# Patient Record
Sex: Female | Born: 1980 | Race: White | Hispanic: No | Marital: Single | Smoking: Current every day smoker
Health system: Southern US, Community
[De-identification: ages and names within clinical notes are randomized; demographics above are authoritative.]

## PROBLEM LIST (undated history)

## (undated) DIAGNOSIS — H9190 Unspecified hearing loss, unspecified ear: Secondary | ICD-10-CM

## (undated) DIAGNOSIS — R87612 Low grade squamous intraepithelial lesion on cytologic smear of cervix (LGSIL): Secondary | ICD-10-CM

## (undated) HISTORY — PX: THROAT SURGERY: SHX803

---

## 2005-08-10 ENCOUNTER — Emergency Department: Payer: Self-pay | Admitting: Emergency Medicine

## 2006-09-15 ENCOUNTER — Emergency Department: Payer: Self-pay | Admitting: Internal Medicine

## 2011-01-10 ENCOUNTER — Emergency Department: Payer: Self-pay | Admitting: Emergency Medicine

## 2011-05-02 ENCOUNTER — Emergency Department: Payer: Self-pay | Admitting: Emergency Medicine

## 2011-05-09 ENCOUNTER — Emergency Department: Payer: Self-pay | Admitting: Emergency Medicine

## 2014-06-29 ENCOUNTER — Emergency Department: Payer: Self-pay | Admitting: Emergency Medicine

## 2014-09-18 ENCOUNTER — Inpatient Hospital Stay: Payer: Self-pay | Admitting: Obstetrics and Gynecology

## 2014-09-18 LAB — BASIC METABOLIC PANEL
ANION GAP: 10 (ref 7–16)
BUN: 8 mg/dL (ref 7–18)
CALCIUM: 9 mg/dL (ref 8.5–10.1)
CREATININE: 0.55 mg/dL — AB (ref 0.60–1.30)
Chloride: 102 mmol/L (ref 98–107)
Co2: 24 mmol/L (ref 21–32)
EGFR (Non-African Amer.): >60
GLUCOSE: 86 mg/dL (ref 65–99)
Osmolality: 270 (ref 275–301)
Potassium: 3.5 mmol/L (ref 3.5–5.1)
SODIUM: 136 mmol/L (ref 136–145)

## 2014-09-18 LAB — HEPATIC FUNCTION PANEL A (ARMC)
ALK PHOS: 86 U/L
AST: 28 U/L (ref 15–37)
Albumin: 3.9 g/dL (ref 3.4–5.0)
BILIRUBIN TOTAL: 0.4 mg/dL (ref 0.2–1.0)
Bilirubin, Direct: <0.1 mg/dL (ref 0.0–0.2)
SGPT (ALT): 22 U/L
Total Protein: 7.6 g/dL (ref 6.4–8.2)

## 2014-09-18 LAB — CBC WITH DIFFERENTIAL/PLATELET
BASOS PCT: 0.5 %
Basophil #: 0.1 10*3/uL (ref 0.0–0.1)
EOS ABS: 0.1 10*3/uL (ref 0.0–0.7)
Eosinophil %: 1.1 %
HCT: 43.7 % (ref 35.0–47.0)
HGB: 14.3 g/dL (ref 12.0–16.0)
Lymphocyte #: 1.9 10*3/uL (ref 1.0–3.6)
Lymphocyte %: 14.8 %
MCH: 29.2 pg (ref 26.0–34.0)
MCHC: 32.8 g/dL (ref 32.0–36.0)
MCV: 89 fL (ref 80–100)
Monocyte #: 0.7 x10 3/mm (ref 0.2–0.9)
Monocyte %: 5.1 %
NEUTROS PCT: 78.5 %
Neutrophil #: 10.2 10*3/uL — ABNORMAL HIGH (ref 1.4–6.5)
Platelet: 293 10*3/uL (ref 150–440)
RBC: 4.91 10*6/uL (ref 3.80–5.20)
RDW: 13.5 % (ref 11.5–14.5)
WBC: 13 10*3/uL — AB (ref 3.6–11.0)

## 2014-09-18 LAB — HCG, QUANTITATIVE, PREGNANCY: BETA HCG, QUANT.: 52941 m[IU]/mL — AB

## 2014-09-18 LAB — TSH: Thyroid Stimulating Horm: 0.536 u[IU]/mL

## 2014-09-18 LAB — URINALYSIS, COMPLETE
BILIRUBIN, UR: NEGATIVE
BLOOD: NEGATIVE
Bacteria: NONE SEEN
Glucose,UR: NEGATIVE mg/dL (ref 0–75)
LEUKOCYTE ESTERASE: NEGATIVE
Nitrite: NEGATIVE
PH: 7 (ref 4.5–8.0)
Protein: NEGATIVE
RBC,UR: 8 /HPF (ref 0–5)
SPECIFIC GRAVITY: 1.02 (ref 1.003–1.030)
Squamous Epithelial: 2

## 2014-09-18 LAB — LIPASE, BLOOD: LIPASE: 77 U/L (ref 73–393)

## 2014-09-26 ENCOUNTER — Emergency Department: Payer: Self-pay | Admitting: Emergency Medicine

## 2014-09-26 LAB — BASIC METABOLIC PANEL
Anion Gap: 7 (ref 7–16)
BUN: 14 mg/dL (ref 7–18)
CHLORIDE: 99 mmol/L (ref 98–107)
Calcium, Total: 7.8 mg/dL — ABNORMAL LOW (ref 8.5–10.1)
Co2: 27 mmol/L (ref 21–32)
Creatinine: 0.65 mg/dL (ref 0.60–1.30)
EGFR (Non-African Amer.): >60
Glucose: 104 mg/dL — ABNORMAL HIGH (ref 65–99)
OSMOLALITY: 267 (ref 275–301)
Potassium: 3.6 mmol/L (ref 3.5–5.1)
Sodium: 133 mmol/L — ABNORMAL LOW (ref 136–145)

## 2014-09-26 LAB — URINALYSIS, COMPLETE
BILIRUBIN, UR: NEGATIVE
Blood: NEGATIVE
GLUCOSE, UR: NEGATIVE mg/dL (ref 0–75)
Hyaline Cast: 2
LEUKOCYTE ESTERASE: NEGATIVE
NITRITE: NEGATIVE
PH: 6 (ref 4.5–8.0)
Specific Gravity: 1.033 (ref 1.003–1.030)
Squamous Epithelial: 2

## 2014-09-26 LAB — CBC
HCT: 43.4 % (ref 35.0–47.0)
HGB: 15 g/dL (ref 12.0–16.0)
MCH: 30.2 pg (ref 26.0–34.0)
MCHC: 34.7 g/dL (ref 32.0–36.0)
MCV: 87 fL (ref 80–100)
Platelet: 328 10*3/uL (ref 150–440)
RBC: 4.98 10*6/uL (ref 3.80–5.20)
RDW: 13.5 % (ref 11.5–14.5)
WBC: 16.9 10*3/uL — ABNORMAL HIGH (ref 3.6–11.0)

## 2014-09-26 LAB — COMPREHENSIVE METABOLIC PANEL
ANION GAP: 12 (ref 7–16)
Albumin: 4 g/dL (ref 3.4–5.0)
Alkaline Phosphatase: 83 U/L
BUN: 20 mg/dL — ABNORMAL HIGH (ref 7–18)
Bilirubin,Total: 0.6 mg/dL (ref 0.2–1.0)
CALCIUM: 9.4 mg/dL (ref 8.5–10.1)
CO2: 28 mmol/L (ref 21–32)
Chloride: 90 mmol/L — ABNORMAL LOW (ref 98–107)
Creatinine: 0.61 mg/dL (ref 0.60–1.30)
EGFR (African American): >60
EGFR (Non-African Amer.): >60
GLUCOSE: 115 mg/dL — AB (ref 65–99)
Osmolality: 264 (ref 275–301)
Potassium: 3 mmol/L — ABNORMAL LOW (ref 3.5–5.1)
SGOT(AST): 45 U/L — ABNORMAL HIGH (ref 15–37)
SGPT (ALT): 48 U/L
Sodium: 130 mmol/L — ABNORMAL LOW (ref 136–145)
TOTAL PROTEIN: 7.9 g/dL (ref 6.4–8.2)

## 2014-09-26 LAB — LIPASE, BLOOD: Lipase: 65 U/L — ABNORMAL LOW (ref 73–393)

## 2014-09-26 LAB — HCG, QUANTITATIVE, PREGNANCY: BETA HCG, QUANT.: 126868 m[IU]/mL — AB

## 2014-11-17 NOTE — L&D Delivery Note (Signed)
Delivery Note At 4:29 PM a viable female was delivered via Vaginal, Spontaneous Delivery (Presentation: direct OA).  APGAR: 8, 9; weight 5 lb 3.3 oz (2360 g).   Placenta status: Intact, Spontaneous.  Cord:  with the following complications: .  Cord pH: n/a  Anesthesia: Epidural  Episiotomy:  none Lacerations:  none Suture Repair: n/a Est. Blood Loss (mL):  200cc  Mom to postpartum.  Baby to Couplet care / Skin to Skin.  Patient was given pitocin for augmentation and received an epidural.  She progressed to complete with a bulging bag.  AROM performed and we began pushing.  Deep early decelerations with contractions/pushing to the 50s, with spontaneous recovery and moderate variability between.  Mom did not push well; could not find a comfortable position and did not respond much to coaching. After finding a position that resulted in progress with maternal effort, she pushed several times and began to crown.  Mom refused to push once baby was crowning, with fetal hearts not tracing well but palpable through the skull at approximately 60.  Ritgen maneuver was used to propel the fetal head through the perineum.  Body quickly followed, and baby was vigorous immediately.  Baby was placed on mom's chest and cord clamped/cut when pulseless.  Cord blood collected.  Placenta delivered spontaneously.  No lacerations.  We sang happy birthday to [place].     Nisa Decaire C 11/06/2015, 6:49 PM

## 2015-03-23 ENCOUNTER — Encounter: Payer: Self-pay | Admitting: Emergency Medicine

## 2015-03-23 ENCOUNTER — Emergency Department
Admission: EM | Admit: 2015-03-23 | Discharge: 2015-03-23 | Disposition: A | Discharge status: Home / Self Care | Payer: Medicaid Other | Attending: Emergency Medicine | Admitting: Emergency Medicine

## 2015-03-23 DIAGNOSIS — F17210 Nicotine dependence, cigarettes, uncomplicated: Secondary | ICD-10-CM | POA: Diagnosis not present

## 2015-03-23 DIAGNOSIS — O210 Mild hyperemesis gravidarum: Secondary | ICD-10-CM

## 2015-03-23 DIAGNOSIS — Z3A08 8 weeks gestation of pregnancy: Secondary | ICD-10-CM | POA: Diagnosis not present

## 2015-03-23 DIAGNOSIS — O99331 Smoking (tobacco) complicating pregnancy, first trimester: Secondary | ICD-10-CM | POA: Insufficient documentation

## 2015-03-23 LAB — COMPREHENSIVE METABOLIC PANEL
ALBUMIN: 4 g/dL (ref 3.5–5.0)
ALK PHOS: 61 U/L (ref 38–126)
ALT: 13 U/L — ABNORMAL LOW (ref 14–54)
AST: 17 U/L (ref 15–41)
Anion gap: 12 (ref 5–15)
BUN: 17 mg/dL (ref 6–20)
CO2: 21 mmol/L — ABNORMAL LOW (ref 22–32)
Calcium: 9.5 mg/dL (ref 8.9–10.3)
Chloride: 102 mmol/L (ref 101–111)
Creatinine, Ser: 0.54 mg/dL (ref 0.44–1.00)
GFR calc Af Amer: >60 mL/min (ref >60)
GFR calc non Af Amer: >60 mL/min (ref >60)
Glucose, Bld: 86 mg/dL (ref 65–99)
POTASSIUM: 3.6 mmol/L (ref 3.5–5.1)
Sodium: 135 mmol/L (ref 135–145)
Total Bilirubin: 0.8 mg/dL (ref 0.3–1.2)
Total Protein: 7.6 g/dL (ref 6.5–8.1)

## 2015-03-23 LAB — CBC WITH DIFFERENTIAL/PLATELET
BASOS ABS: 0 10*3/uL (ref 0–0.1)
BASOS PCT: 0 %
EOS PCT: 0 %
Eosinophils Absolute: 0 10*3/uL (ref 0–0.7)
HEMATOCRIT: 40.6 % (ref 35.0–47.0)
HEMOGLOBIN: 13.6 g/dL (ref 12.0–16.0)
Lymphocytes Relative: 11 %
Lymphs Abs: 1.4 10*3/uL (ref 1.0–3.6)
MCH: 29.1 pg (ref 26.0–34.0)
MCHC: 33.4 g/dL (ref 32.0–36.0)
MCV: 87.2 fL (ref 80.0–100.0)
Monocytes Absolute: 0.5 10*3/uL (ref 0.2–0.9)
Monocytes Relative: 4 %
Neutro Abs: 10.7 10*3/uL — ABNORMAL HIGH (ref 1.4–6.5)
Neutrophils Relative %: 85 %
Platelets: 290 10*3/uL (ref 150–440)
RBC: 4.66 MIL/uL (ref 3.80–5.20)
RDW: 13.7 % (ref 11.5–14.5)
WBC: 12.7 10*3/uL — AB (ref 3.6–11.0)

## 2015-03-23 LAB — LIPASE, BLOOD: Lipase: 23 U/L (ref 22–51)

## 2015-03-23 MED ORDER — METOCLOPRAMIDE HCL 10 MG PO TABS: 10.0000 mg | ORAL_TABLET | Freq: Three times a day (TID) | ORAL | Status: DC | PRN

## 2015-03-23 MED ORDER — ONDANSETRON HCL 4 MG/2ML IJ SOLN
INTRAMUSCULAR | Status: AC
  Administered 2015-03-23: 4 mg via INTRAVENOUS
  Filled 2015-03-23: qty 2

## 2015-03-23 MED ORDER — METOCLOPRAMIDE HCL 5 MG/ML IJ SOLN
10.0000 mg | Freq: Once | INTRAMUSCULAR | Status: AC
  Administered 2015-03-23: 10 mg via INTRAVENOUS

## 2015-03-23 MED ORDER — SODIUM CHLORIDE 0.9 % IV SOLN
1000.0000 mL | Freq: Once | INTRAVENOUS | Status: AC
  Administered 2015-03-23: 1000 mL via INTRAVENOUS

## 2015-03-23 MED ORDER — METOCLOPRAMIDE HCL 5 MG/ML IJ SOLN
INTRAMUSCULAR | Status: AC
  Administered 2015-03-23: 10 mg via INTRAVENOUS
  Filled 2015-03-23: qty 2

## 2015-03-23 MED ORDER — VITAMIN B-6 50 MG PO TABS: 75.0000 mg | ORAL_TABLET | Freq: Every day | ORAL | Status: DC

## 2015-03-23 MED ORDER — PRENATAL MULTIVITAMIN CH: 1.0000 | ORAL_TABLET | Freq: Every day | ORAL | Status: DC

## 2015-03-23 MED ORDER — ONDANSETRON HCL 4 MG/2ML IJ SOLN
4.0000 mg | Freq: Once | INTRAMUSCULAR | Status: AC
  Administered 2015-03-23: 4 mg via INTRAVENOUS

## 2015-03-23 NOTE — ED Notes (Signed)
Pt states that she is taking b6 at home already and it is not working, dr. Pershing Proudschaevitz made aware of the same

## 2015-03-23 NOTE — Discharge Instructions (Signed)
Hyperemesis Gravidarum °Hyperemesis gravidarum is a severe form of nausea and vomiting that happens during pregnancy. Hyperemesis is worse than morning sickness. It may cause you to have nausea or vomiting all day for many days. It may keep you from eating and drinking enough food and liquids. Hyperemesis usually occurs during the first half (the first 20 weeks) of pregnancy. It often goes away once a woman is in her second half of pregnancy. However, sometimes hyperemesis continues through an entire pregnancy.  °CAUSES  °The cause of this condition is not completely known but is thought to be related to changes in the body's hormones when pregnant. It could be from the high level of the pregnancy hormone or an increase in estrogen in the body.  °SIGNS AND SYMPTOMS  °· Severe nausea and vomiting. °· Nausea that does not go away. °· Vomiting that does not allow you to keep any food down. °· Weight loss and body fluid loss (dehydration). °· Having no desire to eat or not liking food you have previously enjoyed. °DIAGNOSIS  °Your health care provider will do a physical exam and ask you about your symptoms. He or she may also order blood tests and urine tests to make sure something else is not causing the problem.  °TREATMENT  °You may only need medicine to control the problem. If medicines do not control the nausea and vomiting, you will be treated in the hospital to prevent dehydration, increased acid in the blood (acidosis), weight loss, and changes in the electrolytes in your body that may harm the unborn baby (fetus). You may need IV fluids.  °HOME CARE INSTRUCTIONS  °· Only take over-the-counter or prescription medicines as directed by your health care provider. °· Try eating a couple of dry crackers or toast in the morning before getting out of bed. °· Avoid foods and smells that upset your stomach. °· Avoid fatty and spicy foods. °· Eat 5-6 small meals a day. °· Do not drink when eating meals. Drink between  meals. °· For snacks, eat high-protein foods, such as cheese. °· Eat or suck on things that have ginger in them. Ginger helps nausea. °· Avoid food preparation. The smell of food can spoil your appetite. °· Avoid iron pills and iron in your multivitamins until after 3-4 months of being pregnant. However, consult with your health care provider before stopping any prescribed iron pills. °SEEK MEDICAL CARE IF:  °· Your abdominal pain increases. °· You have a severe headache. °· You have vision problems. °· You are losing weight. °SEEK IMMEDIATE MEDICAL CARE IF:  °· You are unable to keep fluids down. °· You vomit blood. °· You have constant nausea and vomiting. °· You have excessive weakness. °· You have extreme thirst. °· You have dizziness or fainting. °· You have a fever or persistent symptoms for more than 2-3 days. °· You have a fever and your symptoms suddenly get worse. °MAKE SURE YOU:  °· Understand these instructions. °· Will watch your condition. °· Will get help right away if you are not doing well or get worse. °Document Released: 11/03/2005 Document Revised: 08/24/2013 Document Reviewed: 06/15/2013 °[employer]® Patient Information ©2015 [employer]. This information is not intended to replace advice given to you by your health care provider. Make sure you discuss any questions you have with your health care provider. ° °

## 2015-03-23 NOTE — ED Notes (Signed)
States she is about 8 weeks preg and unable to keep anything down. Active vomiting in triage. Patient Lines/Drains/Airways Status   Active Line/Drains/Airways    **None**

## 2015-03-23 NOTE — ED Provider Notes (Signed)
[facility] Emergency Department Provider Note    ____________________________________________  Time seen: 10:30 PM  I have reviewed the triage vital signs and the nursing notes.   HISTORY  Chief Complaint Morning Sickness       HPI Destiny Ferguson is a 34 y.o. female without any medical problems is about [redacted] weeks pregnant. The patient has not had any prenatal care thus far. The patient denies any vaginal discharge or bleeding. Denies any belly pain but says she has been having multiple episodes of vomiting. She was given Zofran and Reglan and some weight and now is feeling relieved. She continues to deny any abdominal pain. She says she is not on prenatal vitamins as an outpatient.     History reviewed. No pertinent past medical history.  There are no active problems to display for this patient.   History reviewed. No pertinent past surgical history.  No current outpatient prescriptions on file.  Allergies Review of patient's allergies indicates no known allergies.  History reviewed. No pertinent family history.  Social History History  Substance Use Topics  . Smoking status: Current Some Day Smoker  . Smokeless tobacco: Not on file  . Alcohol Use: No    Review of Systems  Constitutional: Negative for fever. Eyes: Negative for visual changes. ENT: Negative for sore throat. Cardiovascular: Negative for chest pain. Respiratory: Negative for shortness of breath. Gastrointestinal: Positive for vomiting.  Genitourinary: Negative for dysuria. Musculoskeletal: Negative for back pain. Skin: Negative for rash. Neurological: Negative for headaches, focal weakness or numbness.   10-point ROS otherwise negative.  ____________________________________________   PHYSICAL EXAM:  VITAL SIGNS: ED Triage Vitals  Enc Vitals Group     BP 03/23/15 1428 110/73 mmHg     Pulse Rate 03/23/15 1428 71     Resp 03/23/15 1428 20     Temp 03/23/15  1428 98.2 F (36.8 C)     Temp Source 03/23/15 1428 Oral     SpO2 03/23/15 1428 98 %     Weight 03/23/15 1428 108 lb (48.988 kg)     Height 03/23/15 1428 5\' 1"  (1.549 m)     Head Cir --      Peak Flow --      Pain Score 03/23/15 1430 0     Pain Loc --      Pain Edu? --      Excl. in GC? --      Constitutional: Alert and oriented. Well appearing and in no distress. Eyes: Conjunctivae are normal. PERRL. Normal extraocular movements. ENT   Head: Normocephalic and atraumatic.   Nose: No congestion/rhinnorhea.   Mouth/Throat: Mucous membranes are moist.   Neck: No stridor. Hematological/Lymphatic/Immunilogical: No cervical lymphadenopathy. Cardiovascular: Normal rate, regular rhythm. Normal and symmetric distal pulses are present in all extremities. No murmurs, rubs, or gallops. Respiratory: Normal respiratory effort without tachypnea nor retractions. Breath sounds are clear and equal bilaterally. No wheezes/rales/rhonchi. Gastrointestinal: Soft and nontender. No distention. No abdominal bruits. There is no CVA tenderness.  Musculoskeletal: Nontender with normal range of motion in all extremities. No joint effusions.  No lower extremity tenderness nor edema. Neurologic:  Normal speech and language. No gross focal neurologic deficits are appreciated. Speech is normal. No gait instability. Skin:  Skin is warm, dry and intact. No rash noted. Psychiatric: Mood and affect are normal. Speech and behavior are normal. Patient exhibits appropriate insight and judgment.  ____________________________________________    LABS (pertinent positives/negatives)  Mildly elevated white blood cell count.  Consistent with pregnancy.  ____________________________________________     ____________________________________________     ____________________________________________   INITIAL IMPRESSION / ASSESSMENT AND PLAN / ED COURSE  Pertinent labs & imaging results that were  available during my care of the patient were reviewed by me and considered in my medical decision making (see chart for details).  ----------------------------------------- 11:00 PM on 03/23/2015 -----------------------------------------    We'll discharge patient with prenatal vitamins and appeared on scene. Patient [facility] for previous paroxysmal follow-up there. ____________________________________________  Patient's symptoms are relieved and patient appears well. FINAL CLINICAL IMPRESSION(S) / ED DIAGNOSES  Hyperemesis gravidarum. Acute common initial visit.   Destiny Longestavid M Britzy Graul, MD 03/23/15 [phone]

## 2015-03-27 NOTE — H&P (Signed)
L&D Evaluation:  History:  HPI 33yo [id number] at 6+5/7 by sure LMP of 08/02/04 presenting with 4 days of n/v, no tolerance of po intake including water. Hx of hyperem with all prior pregnancies, unrelieved throughout preg per pt, but has never been hospitalized and never been on any medications. Not currently on PNV or any other meds.  In ER, received 3 L of fluid as well as phenergen without relief.  Pt is poor historian. Denies PMH, PSH, FH. SH sig for 10 cig daily.   Presents with nausea/vomiting   Patient's Medical History No Chronic Illness   Patient's Surgical History none   Medications None   Allergies NKDA   Social History tobacco   Family History Non-Contributory   ROS:  ROS All systems were reviewed.  HEENT, CNS, GI, GU, Respiratory, CV, Renal and Musculoskeletal systems were found to be normal.   Exam:  Vital Signs stable   General no apparent distress   Mental Status clear   Chest clear   Heart normal sinus rhythm   Abdomen non tender   Skin dry   Lymph no lymphadenopathy   Impression:  Impression nausea/vomiting in pregnancy   Plan:  Plan fluids, Reglan, phenergen,thiamine, zofran if no improvement, IVF, NPO except for ice chips   Comments Reasses in am   Electronic Signatures: Cline CoolsBeasley, Safa Derner E (MD)  (Signed [phone]-Nov-15 19:47)  Authored: L&D Evaluation   Last Updated: 02-Nov-15 19:47 by Cline CoolsBeasley, Rhealyn Cullen E (MD)

## 2015-07-05 LAB — OB RESULTS CONSOLE HIV ANTIBODY (ROUTINE TESTING): HIV: NONREACTIVE

## 2015-07-06 LAB — OB RESULTS CONSOLE ABO/RH: RH TYPE: POSITIVE

## 2015-07-06 LAB — OB RESULTS CONSOLE GC/CHLAMYDIA
Chlamydia: NEGATIVE
Gonorrhea: NEGATIVE

## 2015-07-06 LAB — OB RESULTS CONSOLE ANTIBODY SCREEN: Antibody Screen: NEGATIVE

## 2015-07-06 LAB — OB RESULTS CONSOLE HEPATITIS B SURFACE ANTIGEN: HEP B S AG: NEGATIVE

## 2015-07-06 LAB — OB RESULTS CONSOLE RPR: RPR: NONREACTIVE

## 2015-07-13 LAB — OB RESULTS CONSOLE VARICELLA ZOSTER ANTIBODY, IGG: Varicella: IMMUNE

## 2015-07-13 LAB — OB RESULTS CONSOLE RUBELLA ANTIBODY, IGM: Rubella: IMMUNE

## 2015-11-06 ENCOUNTER — Encounter: Payer: Self-pay | Admitting: Anesthesiology

## 2015-11-06 ENCOUNTER — Inpatient Hospital Stay
Admission: EM | Admit: 2015-11-06 | Discharge: 2015-11-08 | DRG: 775 | Disposition: A | Discharge status: Home / Self Care | Payer: Medicaid Other | Attending: Obstetrics & Gynecology | Admitting: Obstetrics & Gynecology

## 2015-11-06 ENCOUNTER — Encounter: Payer: Self-pay | Admitting: *Deleted

## 2015-11-06 ENCOUNTER — Inpatient Hospital Stay: Payer: Medicaid Other | Admitting: Certified Registered"

## 2015-11-06 DIAGNOSIS — O365990 Maternal care for other known or suspected poor fetal growth, unspecified trimester, not applicable or unspecified: Secondary | ICD-10-CM | POA: Diagnosis present

## 2015-11-06 DIAGNOSIS — F17210 Nicotine dependence, cigarettes, uncomplicated: Secondary | ICD-10-CM | POA: Diagnosis present

## 2015-11-06 DIAGNOSIS — Z3A41 41 weeks gestation of pregnancy: Secondary | ICD-10-CM

## 2015-11-06 DIAGNOSIS — H9190 Unspecified hearing loss, unspecified ear: Secondary | ICD-10-CM | POA: Diagnosis present

## 2015-11-06 DIAGNOSIS — R87612 Low grade squamous intraepithelial lesion on cytologic smear of cervix (LGSIL): Secondary | ICD-10-CM | POA: Diagnosis present

## 2015-11-06 DIAGNOSIS — O99334 Smoking (tobacco) complicating childbirth: Secondary | ICD-10-CM | POA: Diagnosis present

## 2015-11-06 DIAGNOSIS — O480 Post-term pregnancy: Secondary | ICD-10-CM | POA: Diagnosis present

## 2015-11-06 DIAGNOSIS — O09523 Supervision of elderly multigravida, third trimester: Secondary | ICD-10-CM

## 2015-11-06 HISTORY — DX: Low grade squamous intraepithelial lesion on cytologic smear of cervix (LGSIL): R87.612

## 2015-11-06 HISTORY — DX: Unspecified hearing loss, unspecified ear: H91.90

## 2015-11-06 LAB — ABO/RH: ABO/RH(D): O POS

## 2015-11-06 LAB — URINE DRUG SCREEN, QUALITATIVE (ARMC ONLY)
Amphetamines, Ur Screen: NOT DETECTED
BARBITURATES, UR SCREEN: NOT DETECTED
BENZODIAZEPINE, UR SCRN: NOT DETECTED
Cannabinoid 50 Ng, Ur ~~LOC~~: NOT DETECTED
Cocaine Metabolite,Ur ~~LOC~~: NOT DETECTED
MDMA (Ecstasy)Ur Screen: NOT DETECTED
METHADONE SCREEN, URINE: NOT DETECTED
OPIATE, UR SCREEN: NOT DETECTED
Phencyclidine (PCP) Ur S: NOT DETECTED
TRICYCLIC, UR SCREEN: NOT DETECTED

## 2015-11-06 LAB — CHLAMYDIA/NGC RT PCR (ARMC ONLY)
Chlamydia Tr: NOT DETECTED
N GONORRHOEAE: NOT DETECTED

## 2015-11-06 LAB — TYPE AND SCREEN
ABO/RH(D): O POS
ANTIBODY SCREEN: NEGATIVE

## 2015-11-06 LAB — RAPID HIV SCREEN (HIV 1/2 AB+AG)
HIV 1/2 Antibodies: NONREACTIVE
HIV-1 P24 ANTIGEN - HIV24: NONREACTIVE

## 2015-11-06 LAB — CBC
HCT: 38.2 % (ref 35.0–47.0)
HEMOGLOBIN: 12.6 g/dL (ref 12.0–16.0)
MCH: 27.4 pg (ref 26.0–34.0)
MCHC: 32.9 g/dL (ref 32.0–36.0)
MCV: 83.3 fL (ref 80.0–100.0)
Platelets: 287 10*3/uL (ref 150–440)
RBC: 4.58 MIL/uL (ref 3.80–5.20)
RDW: 15.2 % — ABNORMAL HIGH (ref 11.5–14.5)
WBC: 13.9 10*3/uL — AB (ref 3.6–11.0)

## 2015-11-06 MED ORDER — LANOLIN HYDROUS EX OINT: TOPICAL_OINTMENT | CUTANEOUS | Status: DC | PRN

## 2015-11-06 MED ORDER — ONDANSETRON HCL 4 MG/2ML IJ SOLN: 4.0000 mg | INTRAMUSCULAR | Status: DC | PRN

## 2015-11-06 MED ORDER — OXYTOCIN 40 UNITS IN LACTATED RINGERS INFUSION - SIMPLE MED
62.5000 mL/h | INTRAVENOUS | Status: DC | PRN
  Filled 2015-11-06: qty 1000

## 2015-11-06 MED ORDER — OXYTOCIN 40 UNITS IN LACTATED RINGERS INFUSION - SIMPLE MED
62.5000 mL/h | INTRAVENOUS | Status: DC
  Filled 2015-11-06: qty 1000

## 2015-11-06 MED ORDER — PRENATAL MULTIVITAMIN CH
1.0000 | ORAL_TABLET | Freq: Every day | ORAL | Status: DC
  Administered 2015-11-07 – 2015-11-08 (×2): 1 via ORAL
  Filled 2015-11-06 (×2): qty 1

## 2015-11-06 MED ORDER — DIBUCAINE 1 % RE OINT: 1.0000 "application " | TOPICAL_OINTMENT | RECTAL | Status: DC | PRN

## 2015-11-06 MED ORDER — TERBUTALINE SULFATE 1 MG/ML IJ SOLN: 0.2500 mg | Freq: Once | INTRAMUSCULAR | Status: DC | PRN

## 2015-11-06 MED ORDER — OXYTOCIN BOLUS FROM INFUSION: 500.0000 mL | INTRAVENOUS | Status: DC

## 2015-11-06 MED ORDER — INFLUENZA VAC SPLIT QUAD 0.5 ML IM SUSY: 0.5000 mL | PREFILLED_SYRINGE | INTRAMUSCULAR | Status: DC | PRN

## 2015-11-06 MED ORDER — BUTORPHANOL TARTRATE 1 MG/ML IJ SOLN: 1.0000 mg | INTRAMUSCULAR | Status: DC | PRN

## 2015-11-06 MED ORDER — ONDANSETRON HCL 4 MG PO TABS: 4.0000 mg | ORAL_TABLET | ORAL | Status: DC | PRN

## 2015-11-06 MED ORDER — ACETAMINOPHEN 325 MG PO TABS: 650.0000 mg | ORAL_TABLET | ORAL | Status: DC | PRN

## 2015-11-06 MED ORDER — LACTATED RINGERS IV SOLN
INTRAVENOUS | Status: DC
  Administered 2015-11-06: 10:00:00 via INTRAVENOUS

## 2015-11-06 MED ORDER — FENTANYL 2.5 MCG/ML W/ROPIVACAINE 0.2% IN NS 100 ML EPIDURAL INFUSION (ARMC-ANES)
EPIDURAL | Status: AC
  Administered 2015-11-06: 9 mL/h via EPIDURAL
  Filled 2015-11-06: qty 100

## 2015-11-06 MED ORDER — LIDOCAINE HCL (PF) 1 % IJ SOLN
30.0000 mL | INTRAMUSCULAR | Status: DC | PRN
  Filled 2015-11-06: qty 30

## 2015-11-06 MED ORDER — MISOPROSTOL 200 MCG PO TABS
ORAL_TABLET | ORAL | Status: AC
  Filled 2015-11-06: qty 4

## 2015-11-06 MED ORDER — OXYTOCIN 40 UNITS IN LACTATED RINGERS INFUSION - SIMPLE MED
1.0000 m[IU]/min | INTRAVENOUS | Status: DC
  Administered 2015-11-06: 1 m[IU]/min via INTRAVENOUS

## 2015-11-06 MED ORDER — LIDOCAINE-EPINEPHRINE (PF) 1.5 %-1:200000 IJ SOLN
INTRAMUSCULAR | Status: DC | PRN
  Administered 2015-11-06: 3 mL via EPIDURAL

## 2015-11-06 MED ORDER — SIMETHICONE 80 MG PO CHEW: 80.0000 mg | CHEWABLE_TABLET | ORAL | Status: DC | PRN

## 2015-11-06 MED ORDER — TETANUS-DIPHTH-ACELL PERTUSSIS 5-2.5-18.5 LF-MCG/0.5 IM SUSP: 0.5000 mL | INTRAMUSCULAR | Status: DC | PRN

## 2015-11-06 MED ORDER — BUPIVACAINE HCL (PF) 0.25 % IJ SOLN
INTRAMUSCULAR | Status: DC | PRN
  Administered 2015-11-06: 5 mL via EPIDURAL

## 2015-11-06 MED ORDER — CITRIC ACID-SODIUM CITRATE 334-500 MG/5ML PO SOLN: 30.0000 mL | ORAL | Status: DC | PRN

## 2015-11-06 MED ORDER — HYDROCODONE-ACETAMINOPHEN 5-325 MG PO TABS
1.0000 | ORAL_TABLET | ORAL | Status: DC | PRN
  Administered 2015-11-06 – 2015-11-07 (×4): 1 via ORAL
  Filled 2015-11-06 (×4): qty 1

## 2015-11-06 MED ORDER — IBUPROFEN 600 MG PO TABS
600.0000 mg | ORAL_TABLET | Freq: Four times a day (QID) | ORAL | Status: DC
  Administered 2015-11-06 – 2015-11-08 (×7): 600 mg via ORAL
  Filled 2015-11-06 (×7): qty 1

## 2015-11-06 MED ORDER — LACTATED RINGERS IV SOLN: 500.0000 mL | INTRAVENOUS | Status: DC | PRN

## 2015-11-06 MED ORDER — AMMONIA AROMATIC IN INHA
RESPIRATORY_TRACT | Status: AC
  Filled 2015-11-06: qty 10

## 2015-11-06 MED ORDER — DIPHENHYDRAMINE HCL 25 MG PO CAPS: 25.0000 mg | ORAL_CAPSULE | Freq: Four times a day (QID) | ORAL | Status: DC | PRN

## 2015-11-06 MED ORDER — WITCH HAZEL-GLYCERIN EX PADS: 1.0000 "application " | MEDICATED_PAD | CUTANEOUS | Status: DC | PRN

## 2015-11-06 MED ORDER — ONDANSETRON HCL 4 MG/2ML IJ SOLN: 4.0000 mg | Freq: Four times a day (QID) | INTRAMUSCULAR | Status: DC | PRN

## 2015-11-06 MED ORDER — DOCUSATE SODIUM 100 MG PO CAPS
100.0000 mg | ORAL_CAPSULE | Freq: Two times a day (BID) | ORAL | Status: DC
  Administered 2015-11-06 – 2015-11-08 (×4): 100 mg via ORAL
  Filled 2015-11-06 (×4): qty 1

## 2015-11-06 NOTE — Progress Notes (Signed)
RN to the bedside to assist pt. To BR for post delivery void.  Pt. Voided large amt. Of urine, fresh ice pack and peripad put in place. Steady gait. VS WNL, pt. Ready for transfer to postpartum unit via WC.  Nursery RN at the bedside to transfer infant via basinet along with pt.
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
## 2015-11-06 NOTE — Anesthesia Procedure Notes (Signed)
Epidural Patient location during procedure: OB Start time: 11/06/2015 3:22 PM End time: 11/06/2015 3:28 PM  Staffing Anesthesiologist: Berdine AddisonHOMAS, MATHAI Resident/CRNA: Mathews ArgyleLOGAN, Amity Roes Other anesthesia staff: Omer JackWEATHERLY, JANICE Performed by: resident/CRNA   Preanesthetic Checklist Completed: patient identified, site marked, surgical consent, pre-op evaluation, timeout performed, IV checked, risks and benefits discussed and monitors and equipment checked  Epidural Patient position: sitting Prep: Betadine and site prepped and draped Patient monitoring: heart rate, continuous pulse ox and blood pressure Approach: midline Location: L4-L5 Injection technique: LOR saline  Needle:  Needle type: Tuohy  Needle gauge: 18 G Needle length: 9 cm Needle insertion depth: 4.5 cm Catheter type: closed end flexible Catheter size: 20 Guage Catheter at skin depth: 9.5 cm Test dose: negative and 1.5% lidocaine with Epi 1:200 K  Assessment Events: blood not aspirated, injection not painful, no injection resistance, negative IV test and no paresthesia  Additional Notes   Patient tolerated the insertion well without complications.Reason for block:procedure for pain

## 2015-11-06 NOTE — H&P (Signed)
OB History & Physical   History of Present Illness:  Chief Complaint:  Presented with contractions q5 min apart since 0530 HPI:  Destiny Ferguson is a 34 y.o. 428P0050 female with EDC=10/29/2015 at 5849w1d dated by LMP=01/22/2015 and c/w a 24wk 2d ultrasound.  She presented to L&D for evaluation of labor this AM. Upon presentation was 3 cm dilated and there has been no change in cervix over 3 hours, but patient states her contractions are getting stronger. Had some bloody show after her cervical exam. No LOF otherwise. Started care at [facility] at 23 weeks and did not return after Oct 21. LGSIL  PAP- no showed for colpo. AMA- no genetic testing done. Desires BTL pp and 30 day papers have been signed ROS positive for a cough, nasal congestion and rhinorrhea. No sore throat or fever.   Pregnancy Issues: 1. Late entry to prenatal care and no care in third trimester 2. Low BMI 3. AMA 4. Smoker Maternal Medical History:   Past Medical History  Diagnosis Date  . Hearing loss     Wears hearing aid  . Low grade squamous intraepithelial lesion (LGSIL) on cervical Pap smear     Past Surgical History  Procedure Laterality Date  . Throat surgery      Tonsilectomy    No Known Allergies  Prior to Admission medications                               Denies taking any medications at this time   [facility]   Social History: She  reports that she has quit smoking. She has never used smokeless tobacco. She reports that she does not drink alcohol or use illicit drugs. Was smoking 1/2 PPD  Family History: family history includes Asthma in her mother; Cancer - Cervical in her mother; Heart disease in her maternal grandmother; Hypertension in her maternal grandmother.   Review of Systems: Negative x 10 systems reviewed except as noted in the HPI.     Physical Exam:  Vital Signs: BP 117/79 mmHg  Pulse 76  Temp(Src) 98.1 F (36.7 C) (Oral)   Resp 18  Ht 5\' 1"  (1.549 m)  Wt 50.712 kg (111 lb 12.8 oz)  BMI 21.14 kg/m2  LMP 01/22/2015 General: breathing thru contractions HEENT: normocephalic, atraumatic Heart: regular rate & rhythm.  No murmurs Lungs: clear to auscultation bilaterally, normal respiratory effort Abdomen: soft, gravid, non-tender;  EFW:5# Pelvic:   External: Normal external female genitalia  Cervix: Dilation: 3 / Effacement (%): 80 / Station: -1    Extremities: non-tender, symmetric, no edema bilaterally.  DTRs:+2 Neurologic: Alert & oriented x 3.    Results for orders placed or performed during the hospital encounter of 11/06/15 (from the past 24 hour(s))  CBC     Status: Abnormal   Collection Time: 11/06/15 10:24 AM  Result Value Ref Range   WBC 13.9 (H) 3.6 - 11.0 K/uL   RBC 4.58 3.80 - 5.20 MIL/uL   Hemoglobin 12.6 12.0 - 16.0 g/dL   HCT 40.938.2 81.135.0 - 91.447.0 %   MCV 83.3 80.0 - 100.0 fL   MCH 27.4 26.0 - 34.0 pg   MCHC 32.9 32.0 - 36.0 g/dL   RDW 78.215.2 (H) 95.611.5 - 21.314.5 %   Platelets 287 150 - 440 K/uL  Type and screen [facility]     Status: None (Preliminary result)  Collection Time: 11/06/15 10:25 AM  Result Value Ref Range   ABO/RH(D) PENDING    Antibody Screen PENDING    Sample Expiration 11/09/2015     Pertinent Results:  Prenatal Labs: Blood type/Rh O POS  Antibody screen neg  Rubella Immune  Varicella Immune  RPR NR  HBsAg Neg  HIV NR  GC neg  Chlamydia neg  Genetic screening negative  1 hour GTT 123  3 hour GTT NA  GBS Not done   FHT: 135-140 with accelerations to 160s, mod variability, occ variable with contractions TOCO: q 4-7 min apart    Cephalic by leopolds  Assessment:  Destiny Ferguson is a 34 y.o. G48P0050 female at [redacted]w[redacted]d with early labor-will augment since 53 weeks, poor prenatal care and SGA Small for gestational age Insufficient prenatal care   Plan:  1. Admit to Labor & Delivery 2. CBC, T&S, Clrs, IVF 3. GBS culture done  4. Consents  obtained. 5. Continuous efm/toco 6. Pitocin augmentation  -----  Farrel Conners CNM [facility]

## 2015-11-06 NOTE — Anesthesia Preprocedure Evaluation (Signed)
Anesthesia Evaluation  Patient identified by MRN, date of birth, ID band Patient awake    Reviewed: Allergy & Precautions, NPO status , Patient's Chart, lab work & pertinent test results, reviewed documented beta blocker date and time   Airway Mallampati: II  TM Distance: >3 FB     Dental  (+) Chipped   Pulmonary former smoker,           Cardiovascular      Neuro/Psych    GI/Hepatic   Endo/Other    Renal/GU      Musculoskeletal   Abdominal   Peds  Hematology   Anesthesia Other Findings   Reproductive/Obstetrics                             Anesthesia Physical Anesthesia Plan  ASA: II  Anesthesia Plan: Epidural   Post-op Pain Management:    Induction:   Airway Management Planned:   Additional Equipment:   Intra-op Plan:   Post-operative Plan:   Informed Consent: I have reviewed the patients History and Physical, chart, labs and discussed the procedure including the risks, benefits and alternatives for the proposed anesthesia with the patient or authorized representative who has indicated his/her understanding and acceptance.     Plan Discussed with: CRNA  Anesthesia Plan Comments:         Anesthesia Quick Evaluation  

## 2015-11-07 LAB — CBC
HEMATOCRIT: 33.1 % — AB (ref 35.0–47.0)
HEMOGLOBIN: 10.9 g/dL — AB (ref 12.0–16.0)
MCH: 27.7 pg (ref 26.0–34.0)
MCHC: 33 g/dL (ref 32.0–36.0)
MCV: 84 fL (ref 80.0–100.0)
Platelets: 263 10*3/uL (ref 150–440)
RBC: 3.94 MIL/uL (ref 3.80–5.20)
RDW: 15.1 % — ABNORMAL HIGH (ref 11.5–14.5)
WBC: 13.5 10*3/uL — AB (ref 3.6–11.0)

## 2015-11-07 LAB — RPR: RPR: NONREACTIVE

## 2015-11-07 NOTE — Progress Notes (Signed)
Subjective:    Objective:   Blood pressure 114/74, pulse 80, temperature 98 F (36.7 C), temperature source Oral, resp. rate 20, height  (1.549 m), weight 50.712 kg (111 lb 12.8 oz), last menstrual period 01/22/2015, SpO2 98 %, unknown if currently breastfeeding.  General: NAD Pulmonary: no increased work of breathing Abdomen: non-distended, non-tender, fundus firm at level of umbilicus Extremities: no edema, no erythema, no tenderness  Results for orders placed or performed during the hospital encounter of 11/06/15 (from the past 72 hour(s))  Rapid HIV screen (HIV 1/2 Ab+Ag) ([facility] Only)     Status: None   Collection Time: 11/06/15 10:24 AM  Result Value Ref Range   HIV-1 P24 Antigen - HIV24 NON REACTIVE NON REACTIVE   HIV 1/2 Antibodies NON REACTIVE NON REACTIVE   Interpretation (HIV Ag Ab)      A non reactive test result means that HIV 1 or HIV 2 antibodies and HIV 1 p24 antigen were not detected in the specimen.  CBC     Status: Abnormal   Collection Time: 11/06/15 10:24 AM  Result Value Ref Range   WBC 13.9 (H) 3.6 - 11.0 K/uL   RBC 4.58 3.80 - 5.20 MIL/uL   Hemoglobin 12.6 12.0 - 16.0 g/dL   HCT 29.5 28.4 - 13.2 %   MCV 83.3 80.0 - 100.0 fL   MCH 27.4 26.0 - 34.0 pg   MCHC 32.9 32.0 - 36.0 g/dL   RDW 44.0 (H) 10.2 - 72.5 %   Platelets 287 150 - 440 K/uL  RPR     Status: None   Collection Time: 11/06/15 10:24 AM  Result Value Ref Range   RPR Ser Ql Non Reactive Non Reactive    Comment: (NOTE) Performed At: [facility] [address] [place], [place] [id number] Mila Homer MD QQ:[id number]   Type and screen [facility]     Status: None   Collection Time: 11/06/15 10:25 AM  Result Value Ref Range   ABO/RH(D) O POS    Antibody Screen NEG    Sample Expiration 11/09/2015   ABO/Rh     Status: None   Collection Time: 11/06/15 10:26 AM  Result Value Ref Range   ABO/RH(D) O POS   Urine Drug Screen, Qualitative ([facility] only)      Status: None   Collection Time: 11/06/15 10:41 AM  Result Value Ref Range   Tricyclic, Ur Screen NONE DETECTED NONE DETECTED   Amphetamines, Ur Screen NONE DETECTED NONE DETECTED   MDMA (Ecstasy)Ur Screen NONE DETECTED NONE DETECTED   Cocaine Metabolite,Ur [place] NONE DETECTED NONE DETECTED   Opiate, Ur Screen NONE DETECTED NONE DETECTED   Phencyclidine (PCP) Ur S NONE DETECTED NONE DETECTED   Cannabinoid 50 Ng, Ur [place] NONE DETECTED NONE DETECTED   Barbiturates, Ur Screen NONE DETECTED NONE DETECTED   Benzodiazepine, Ur Scrn NONE DETECTED NONE DETECTED   Methadone Scn, Ur NONE DETECTED NONE DETECTED    Comment: (NOTE) 100  Tricyclics, urine               Cutoff 1000 ng/mL 200  Amphetamines, urine             Cutoff 1000 ng/mL 300  MDMA (Ecstasy), urine           Cutoff 500 ng/mL 400  Cocaine Metabolite, urine       Cutoff 300 ng/mL 500  Opiate, urine  Cutoff 300 ng/mL 600  Phencyclidine (PCP), urine      Cutoff 25 ng/mL 700  Cannabinoid, urine              Cutoff 50 ng/mL 800  Barbiturates, urine             Cutoff 200 ng/mL 900  Benzodiazepine, urine           Cutoff 200 ng/mL 1000 Methadone, urine                Cutoff 300 ng/mL 1100 1200 The urine drug screen provides only a preliminary, unconfirmed 1300 analytical test result and should not be used for non-medical 1400 purposes. Clinical consideration and professional judgment should 1500 be applied to any positive drug screen result due to possible 1600 interfering substances. A more specific alternate chemical method 1700 must be used in order to obtain a confirmed analytical result.  1800 Gas chromato graphy / mass spectrometry (GC/MS) is the preferred 1900 confirmatory method.   Chlamydia/NGC rt PCR ([facility] only)     Status: None   Collection Time: 11/06/15 10:41 AM  Result Value Ref Range   Specimen source GC/Chlam URINE, RANDOM    Chlamydia Tr NOT DETECTED NOT DETECTED   N gonorrhoeae NOT DETECTED NOT  DETECTED    Comment: (NOTE) 100  This methodology has not been evaluated in pregnant women or in 200  patients with a history of hysterectomy. 300 400  This methodology will not be performed on patients less than 3114  years of age.   Culture, beta strep (group b only)     Status: None (Preliminary result)   Collection Time: 11/06/15 11:20 AM  Result Value Ref Range   Specimen Description VAGINAL/RECTAL    Special Requests NONE    Culture NO BETA HEMOLYTIC STREPTOCOCCI ISOLATED    Report Status PENDING   CBC     Status: Abnormal   Collection Time: 11/07/15  4:47 AM  Result Value Ref Range   WBC 13.5 (H) 3.6 - 11.0 K/uL   RBC 3.94 3.80 - 5.20 MIL/uL   Hemoglobin 10.9 (L) 12.0 - 16.0 g/dL   HCT 84.133.1 (L) 32.435.0 - 40.147.0 %   MCV 84.0 80.0 - 100.0 fL   MCH 27.7 26.0 - 34.0 pg   MCHC 33.0 32.0 - 36.0 g/dL   RDW 02.715.1 (H) 25.311.5 - 66.414.5 %   Platelets 263 150 - 440 K/uL    Assessment:   34 y.o. [id number] postpartum day # 1 TSVD  Plan:    1) No PNC - negative UDS on admission  2) O pos / RI / VZI   3) TDAP status  (believes received at [facility]) / needs influenza  4) Bottle/Nexplanon  5) Disposition D/C PPD#2

## 2015-11-07 NOTE — Anesthesia Postprocedure Evaluation (Signed)
Anesthesia Post Note  Patient: Destiny PetrinCasandra D Ferguson  Procedure(s) Performed: * No procedures listed *  Patient location during evaluation: Mother Baby Anesthesia Type: Epidural Level of consciousness: awake, awake and alert and oriented Pain management: pain level controlled Vital Signs Assessment: post-procedure vital signs reviewed and stable Respiratory status: spontaneous breathing Cardiovascular status: blood pressure returned to baseline and stable Postop Assessment: no headache, no backache and epidural receding Anesthetic complications: no    Last Vitals:  Filed Vitals:   11/07/15 0347 11/07/15 0824  BP: 99/81 105/67  Pulse: 78 74  Temp: 36.9 C 36.6 C  Resp: 17 18    Last Pain:  Filed Vitals:   11/07/15 0824  PainSc: 5                  Jermel Artley

## 2015-11-07 NOTE — Anesthesia Postprocedure Evaluation (Signed)
Anesthesia Post Note  Patient: Destiny PetrinCasandra D Ebanks  Procedure(s) Performed: * No procedures listed *  Patient location during evaluation: Nursing Unit Anesthesia Type: Epidural Level of consciousness: awake and alert Pain management: pain level controlled Vital Signs Assessment: post-procedure vital signs reviewed and stable Respiratory status: spontaneous breathing Cardiovascular status: stable Postop Assessment: no headache and no backache Anesthetic complications: no    Last Vitals:  Filed Vitals:   11/07/15 0824 11/07/15 1123  BP: 105/67 107/69  Pulse: 74 82  Temp: 36.6 C 36.6 C  Resp: 18 18    Last Pain:  Filed Vitals:   11/07/15 1124  PainSc: 0-No pain                 Clydene PughBeane, Mame Twombly D

## 2015-11-08 LAB — CULTURE, BETA STREP (GROUP B ONLY)

## 2015-11-08 LAB — SURGICAL PATHOLOGY

## 2015-11-08 MED ORDER — MEDROXYPROGESTERONE ACETATE 150 MG/ML IM SUSP
150.0000 mg | Freq: Once | INTRAMUSCULAR | Status: AC
  Administered 2015-11-08: 150 mg via INTRAMUSCULAR
  Filled 2015-11-08: qty 1

## 2015-11-08 NOTE — Clinical Social Work Maternal (Signed)
  CLINICAL SOCIAL WORK MATERNAL/CHILD NOTE  Patient Details  Name: Destiny Ferguson MRN: [id number] Date of Birth: 03-28-1981  Date:  11/08/2015  Clinical Social Worker Initiating Note:  Yakir Wenke LCSW Date/ Time Initiated:  11/08/15/      Child's Name:  Destiny Ferguson   Legal Guardian:  Mother   Need for Interpreter:  None   Date of Referral:  11/08/15     Reason for Referral:  Other (Comment) [employer] Resources)   Referral Source:  RN   Address:  [address],[place]  Phone number:      Household Members:  Friends, Significant Other   Natural Supports (not living in the home):  Friends   Professional Supports: None   Employment: Unemployed   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 Both parents were pleasant and cooperative for assessment. Their current living situation a little unusual they will be staying with her sons father. While conducting the interview Car seat was provided and tested with their child. The fathers mother called and has picked up a basinet for the baby and her best friend brought clothes and diapers and wipes and new born bottles. They identified they were both unemployed. LCSW took the opportunity to discuss [place] [employer] [place] for food stamps. The united way community resource list and [employer] and [place] for new Dads [phone]. They will call in the future. Its the parents plan to  Be attached to the [facility] in [place].  CSW Plan/Description:  Information/Referral to [employer] , No Further Intervention Required/No Barriers to Discharge, Engineer, mining , Other (Comment) (Provided [facility] and spoke to them about  [employer] center and adult learning program to obtain GED)    Joana Reamer, LCSW 11/08/2015, 10:38 AM

## 2015-11-08 NOTE — Care Management (Signed)
Received request for help with housing, buying food, and clothing. Spoke with Ms. Boodram at the bedside. States she has maternity medicaid. Discussed that she would need to call the Department of Social Services to arrange an appointment to start the medicaid application for baby Layne BentonGrace Olivia. Resources for [facility] information given to Ms. Jurich. Discharge to home today. Gwenette GreetBrenda S Alston Berrie RN MSN CCM Care Management [phone]

## 2015-11-08 NOTE — Discharge Summary (Signed)
Obstetric Discharge Summary Reason for Admission: onset of labor Prenatal Procedures: none Intrapartum Procedures: spontaneous vaginal delivery Postpartum Procedures: none Complications-Operative and Postpartum: none HEMOGLOBIN  Date Value Ref Range Status  11/07/2015 10.9* 12.0 - 16.0 g/dL Final   HGB  Date Value Ref Range Status  09/26/2014 15.0 12.0-16.0 g/dL Final   HCT  Date Value Ref Range Status  11/07/2015 33.1* 35.0 - 47.0 % Final  09/26/2014 43.4 35.0-47.0 % Final   BP 136/76 mmHg  Pulse 87  Temp(Src) 98.3 F (36.8 C) (Oral)  Resp 18  Ht 5\' 1"  (1.549 m)  Wt 50.712 kg (111 lb 12.8 oz)  BMI 21.14 kg/m2  SpO2 98%  LMP 01/22/2015  Breastfeeding? Unknown  Bottle feeding Physical Exam:  General: alert and cooperative Lochia: appropriate Uterine Fundus: firm DVT Evaluation: No evidence of DVT seen on physical exam.  Discharge Diagnoses: Term Pregnancy-delivered  Discharge Information: Date: 11/08/2015 Activity: pelvic rest Diet: routine Medications: PNV Condition: stable Instructions: refer to practice specific booklet Discharge to: home  Contraception: Depo at hospital- would like IUD at 6 week visit  Follow-up Information    Follow up with [facility]. Schedule an appointment as soon as possible for a visit in 6 weeks.   Contact information:   [address] [place] [id number] [phone]       Newborn Data: Live born female  Birth Weight: 5 lb 3.3 oz (2360 g) APGAR: 8, 9  Home with mother.  Destiny Ferguson 11/08/2015, 10:33 AM   This patient and plan were discussed with Dr Jean RosenthalJackson 11/08/2015

## 2015-11-08 NOTE — Discharge Instructions (Signed)
Follow up sooner with fever, problems breathing, pain not helped by medications, severe depression( more than just baby blues, wanting to hurt yourself or the baby), severe bleeding ( saturating more than one pad an hour or large palm sized clots), no heavy lifting , no driving while taking narcotics, no douches, intercourse, tampons or enemas for 6 weeks  °

## 2015-11-08 NOTE — Clinical Social Work Maternal (Deleted)
  CLINICAL SOCIAL WORK MATERNAL/CHILD NOTE  Patient Details  Name: Destiny Ferguson MRN: [id number] Date of Birth: Mar 24, 1981  Date:  11/08/2015  Clinical Social Worker Initiating Note:  Aryaa Bunting LCSW Date/ Time Initiated:  11/08/15/      Child's Name:  Destiny Ferguson   Legal Guardian:  Mother   Need for Interpreter:  None   Date of Referral:  11/08/15     Reason for Referral:  Other (Comment) [employer] Resources)   Referral Source:  RN   Address:  [address],[place]  Phone number:      Household Members:  Friends, Significant Other   Natural Supports (not living in the home):  Friends   Professional Supports: None   Employment: Unemployed   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 Baby was taken to be fitted in the donated car seat. They will be residing with Mothers (father of her son) [address] During interview Babys fathers mother called and stated she picked up brand new basinet for the baby. Pts sister dropped off wet wipes diaper and clothing and bedding for baby [name]. Several resources discussed they will apply at [facility] for food stamps and some financial assistance. They are connected to the [facility] Department. Discussed at length other community resources such as [employer] to obtain job skills support and adult learning programs to help them obtain their GEDs. She did have some prior CPS  involvment about 12 years ago but no issues since- Her daughter (1) is raised by Grandmother and her son (87) raised by his Dad No further needs identified.  CSW Plan/Description:  Information/Referral to [employer] , No Further Intervention Required/No Barriers to Discharge, Engineer, mining , Other (Comment) (Provided [facility] and spoke to them about  [employer] center and adult learning program to obtain GED)    Joana Reamer, LCSW 11/08/2015, 10:48 AM

## 2015-11-08 NOTE — Progress Notes (Signed)
All discharge instructions given to patient and she voices understanding of all instructions given.  She will make her own f/u appt. No prescriptions given. Patient discharged home with infant in arms in wheelchair escorted out by auxillary

## 2016-11-17 NOTE — L&D Delivery Note (Signed)
Delivery Note At 7:27 AM a viable and healthy female "[name]" was delivered via Vaginal, Vacuum Investment banker, operational(Extractor) (Presentation: ROA ).  APGAR: 8, 9; weight pending .   Placenta status: spontaneously Cord: 3VC  with the following complications: none .  Cord pH:  Unable to be collected due to small, thin cord  Anesthesia:  Epidural  Episiotomy:  none Lacerations:  none Est. Blood Loss (mL):  300  Mom to postpartum.  Baby to Couplet care / Skin to Skin.  [id number] at 39+1wks by 30 week ultrasound with insufficient prenatal care presented in active labor, SROM at home for clear fluid. She progressed to second stage with an epidural. Her pushing effort seemed appropriate but no fetal descent was noted. Vacuum applied when fetal heart tones dropped to the 80s, baby in ROA position at +3 station and no forward movement with maternal efforts. 1 popoff, 2 pulls and baby delivered over intact perineum.  GBS unknown, not treated in labor due to term pregnancy with no prior GBS sepsis  Christeen DouglasBethany Kareen Jefferys 06/17/2017, 7:37 AM

## 2017-03-26 LAB — OB RESULTS CONSOLE HIV ANTIBODY (ROUTINE TESTING): HIV: NONREACTIVE

## 2017-03-27 ENCOUNTER — Other Ambulatory Visit: Payer: Self-pay | Admitting: Physician Assistant

## 2017-03-27 DIAGNOSIS — Z3689 Encounter for other specified antenatal screening: Secondary | ICD-10-CM

## 2017-03-27 LAB — OB RESULTS CONSOLE HIV ANTIBODY (ROUTINE TESTING): HIV: NONREACTIVE

## 2017-03-27 LAB — OB RESULTS CONSOLE HEPATITIS B SURFACE ANTIGEN: Hepatitis B Surface Ag: NEGATIVE

## 2017-03-27 LAB — OB RESULTS CONSOLE RPR: RPR: NONREACTIVE

## 2017-03-28 LAB — OB RESULTS CONSOLE GC/CHLAMYDIA
Chlamydia: NEGATIVE
GC PROBE AMP, GENITAL: NEGATIVE

## 2017-04-16 ENCOUNTER — Encounter: Payer: Self-pay | Admitting: *Deleted

## 2017-04-16 ENCOUNTER — Ambulatory Visit
Admission: RE | Admit: 2017-04-16 | Discharge: 2017-04-16 | Disposition: A | Discharge status: Home / Self Care | Payer: Medicaid Other | Source: Ambulatory Visit | Attending: Maternal and Fetal Medicine | Admitting: Maternal and Fetal Medicine

## 2017-04-16 DIAGNOSIS — Z3A30 30 weeks gestation of pregnancy: Secondary | ICD-10-CM | POA: Diagnosis not present

## 2017-04-16 DIAGNOSIS — O09523 Supervision of elderly multigravida, third trimester: Secondary | ICD-10-CM | POA: Insufficient documentation

## 2017-04-16 DIAGNOSIS — Z3689 Encounter for other specified antenatal screening: Secondary | ICD-10-CM

## 2017-04-17 LAB — OB RESULTS CONSOLE VARICELLA ZOSTER ANTIBODY, IGG: Varicella: IMMUNE

## 2017-04-17 LAB — OB RESULTS CONSOLE RUBELLA ANTIBODY, IGM: RUBELLA: IMMUNE

## 2017-06-17 ENCOUNTER — Inpatient Hospital Stay: Payer: Medicaid Other | Admitting: Anesthesiology

## 2017-06-17 ENCOUNTER — Inpatient Hospital Stay
Admission: EM | Admit: 2017-06-17 | Discharge: 2017-06-19 | DRG: 775 | Disposition: A | Discharge status: Home / Self Care | Payer: Medicaid Other | Attending: Obstetrics and Gynecology | Admitting: Obstetrics and Gynecology

## 2017-06-17 DIAGNOSIS — Z3490 Encounter for supervision of normal pregnancy, unspecified, unspecified trimester: Secondary | ICD-10-CM

## 2017-06-17 DIAGNOSIS — Z3A39 39 weeks gestation of pregnancy: Secondary | ICD-10-CM | POA: Diagnosis not present

## 2017-06-17 DIAGNOSIS — H9193 Unspecified hearing loss, bilateral: Secondary | ICD-10-CM | POA: Diagnosis present

## 2017-06-17 DIAGNOSIS — Z3493 Encounter for supervision of normal pregnancy, unspecified, third trimester: Secondary | ICD-10-CM | POA: Diagnosis present

## 2017-06-17 DIAGNOSIS — O9081 Anemia of the puerperium: Secondary | ICD-10-CM | POA: Diagnosis not present

## 2017-06-17 DIAGNOSIS — D62 Acute posthemorrhagic anemia: Secondary | ICD-10-CM | POA: Diagnosis not present

## 2017-06-17 DIAGNOSIS — O99334 Smoking (tobacco) complicating childbirth: Principal | ICD-10-CM | POA: Diagnosis present

## 2017-06-17 DIAGNOSIS — F17210 Nicotine dependence, cigarettes, uncomplicated: Secondary | ICD-10-CM | POA: Diagnosis present

## 2017-06-17 LAB — TYPE AND SCREEN
ABO/RH(D): O POS
ANTIBODY SCREEN: NEGATIVE

## 2017-06-17 LAB — CBC
HEMATOCRIT: 36.1 % (ref 35.0–47.0)
Hemoglobin: 12 g/dL (ref 12.0–16.0)
MCH: 25.8 pg — ABNORMAL LOW (ref 26.0–34.0)
MCHC: 33.3 g/dL (ref 32.0–36.0)
MCV: 77.3 fL — ABNORMAL LOW (ref 80.0–100.0)
PLATELETS: 309 10*3/uL (ref 150–440)
RBC: 4.67 MIL/uL (ref 3.80–5.20)
RDW: 15.2 % — AB (ref 11.5–14.5)
WBC: 13.1 10*3/uL — AB (ref 3.6–11.0)

## 2017-06-17 MED ORDER — OXYTOCIN 40 UNITS IN LACTATED RINGERS INFUSION - SIMPLE MED
2.5000 [IU]/h | INTRAVENOUS | Status: DC
  Administered 2017-06-17: 2.5 [IU]/h via INTRAVENOUS

## 2017-06-17 MED ORDER — SODIUM CHLORIDE 0.9 % IV SOLN: 250.0000 mL | INTRAVENOUS | Status: DC | PRN

## 2017-06-17 MED ORDER — TETANUS-DIPHTH-ACELL PERTUSSIS 5-2.5-18.5 LF-MCG/0.5 IM SUSP: 0.5000 mL | Freq: Once | INTRAMUSCULAR | Status: DC

## 2017-06-17 MED ORDER — EPHEDRINE 5 MG/ML INJ
10.0000 mg | INTRAVENOUS | Status: DC | PRN
  Filled 2017-06-17: qty 2

## 2017-06-17 MED ORDER — FENTANYL 2.5 MCG/ML W/ROPIVACAINE 0.15% IN NS 100 ML EPIDURAL (ARMC)
EPIDURAL | Status: AC
  Filled 2017-06-17: qty 100

## 2017-06-17 MED ORDER — DIPHENHYDRAMINE HCL 50 MG/ML IJ SOLN: 12.5000 mg | INTRAMUSCULAR | Status: DC | PRN

## 2017-06-17 MED ORDER — LACTATED RINGERS IV SOLN: 500.0000 mL | Freq: Once | INTRAVENOUS | Status: DC

## 2017-06-17 MED ORDER — SENNOSIDES-DOCUSATE SODIUM 8.6-50 MG PO TABS
2.0000 | ORAL_TABLET | ORAL | Status: DC
  Administered 2017-06-17 – 2017-06-18 (×2): 2 via ORAL
  Filled 2017-06-17 (×2): qty 2

## 2017-06-17 MED ORDER — LACTATED RINGERS IV SOLN
500.0000 mL | INTRAVENOUS | Status: DC | PRN
  Administered 2017-06-17: 500 mL via INTRAVENOUS

## 2017-06-17 MED ORDER — SOD CITRATE-CITRIC ACID 500-334 MG/5ML PO SOLN: 30.0000 mL | ORAL | Status: DC | PRN

## 2017-06-17 MED ORDER — BUPIVACAINE HCL (PF) 0.25 % IJ SOLN
INTRAMUSCULAR | Status: DC | PRN
  Administered 2017-06-17: 10 mL via EPIDURAL

## 2017-06-17 MED ORDER — FLEET ENEMA 7-19 GM/118ML RE ENEM: 1.0000 | ENEMA | Freq: Every day | RECTAL | Status: DC | PRN

## 2017-06-17 MED ORDER — IBUPROFEN 600 MG PO TABS
600.0000 mg | ORAL_TABLET | Freq: Four times a day (QID) | ORAL | Status: DC
  Administered 2017-06-17 – 2017-06-19 (×9): 600 mg via ORAL
  Filled 2017-06-17 (×9): qty 1

## 2017-06-17 MED ORDER — ZOLPIDEM TARTRATE 5 MG PO TABS
5.0000 mg | ORAL_TABLET | Freq: Every evening | ORAL | Status: DC | PRN
Start: 2017-06-17 — End: 2017-06-19

## 2017-06-17 MED ORDER — FENTANYL 2.5 MCG/ML W/ROPIVACAINE 0.15% IN NS 100 ML EPIDURAL (ARMC)
12.0000 mL/h | EPIDURAL | Status: DC
  Administered 2017-06-17: 12 mL/h via EPIDURAL

## 2017-06-17 MED ORDER — SIMETHICONE 80 MG PO CHEW: 80.0000 mg | CHEWABLE_TABLET | ORAL | Status: DC | PRN

## 2017-06-17 MED ORDER — LIDOCAINE HCL (PF) 1 % IJ SOLN
INTRAMUSCULAR | Status: AC
  Filled 2017-06-17: qty 30

## 2017-06-17 MED ORDER — PHENYLEPHRINE 40 MCG/ML (10ML) SYRINGE FOR IV PUSH (FOR BLOOD PRESSURE SUPPORT)
80.0000 ug | PREFILLED_SYRINGE | INTRAVENOUS | Status: DC | PRN
  Filled 2017-06-17: qty 5

## 2017-06-17 MED ORDER — OXYTOCIN BOLUS FROM INFUSION
500.0000 mL | Freq: Once | INTRAVENOUS | Status: AC
  Administered 2017-06-17: 500 mL via INTRAVENOUS

## 2017-06-17 MED ORDER — LIDOCAINE-EPINEPHRINE (PF) 1.5 %-1:200000 IJ SOLN
INTRAMUSCULAR | Status: DC | PRN
  Administered 2017-06-17: 3 mL via PERINEURAL

## 2017-06-17 MED ORDER — SODIUM CHLORIDE 0.9% FLUSH: 3.0000 mL | Freq: Two times a day (BID) | INTRAVENOUS | Status: DC

## 2017-06-17 MED ORDER — ONDANSETRON HCL 4 MG PO TABS: 4.0000 mg | ORAL_TABLET | ORAL | Status: DC | PRN

## 2017-06-17 MED ORDER — COCONUT OIL OIL: 1.0000 "application " | TOPICAL_OIL | Status: DC | PRN

## 2017-06-17 MED ORDER — PRENATAL MULTIVITAMIN CH
1.0000 | ORAL_TABLET | Freq: Every day | ORAL | Status: DC
  Administered 2017-06-17 – 2017-06-19 (×3): 1 via ORAL
  Filled 2017-06-17 (×3): qty 1

## 2017-06-17 MED ORDER — OXYTOCIN 10 UNIT/ML IJ SOLN
INTRAMUSCULAR | Status: AC
Start: 2017-06-17 — End: 2017-06-17
  Filled 2017-06-17: qty 2

## 2017-06-17 MED ORDER — DIBUCAINE 1 % RE OINT
1.0000 | TOPICAL_OINTMENT | RECTAL | Status: DC | PRN
Start: 2017-06-17 — End: 2017-06-19

## 2017-06-17 MED ORDER — AMMONIA AROMATIC IN INHA
RESPIRATORY_TRACT | Status: AC
  Filled 2017-06-17: qty 10

## 2017-06-17 MED ORDER — ACETAMINOPHEN 325 MG PO TABS
650.0000 mg | ORAL_TABLET | ORAL | Status: DC | PRN
  Administered 2017-06-17: 650 mg via ORAL
  Filled 2017-06-17 (×3): qty 2

## 2017-06-17 MED ORDER — WITCH HAZEL-GLYCERIN EX PADS: 1.0000 "application " | MEDICATED_PAD | CUTANEOUS | Status: DC | PRN

## 2017-06-17 MED ORDER — SODIUM CHLORIDE 0.9% FLUSH: 3.0000 mL | INTRAVENOUS | Status: DC | PRN

## 2017-06-17 MED ORDER — NICOTINE 14 MG/24HR TD PT24
14.0000 mg | MEDICATED_PATCH | Freq: Every day | TRANSDERMAL | Status: DC
  Administered 2017-06-17: 14 mg via TRANSDERMAL
  Filled 2017-06-17 (×2): qty 1

## 2017-06-17 MED ORDER — LIDOCAINE HCL (PF) 1 % IJ SOLN: 30.0000 mL | INTRAMUSCULAR | Status: DC | PRN

## 2017-06-17 MED ORDER — ACETAMINOPHEN 325 MG PO TABS
650.0000 mg | ORAL_TABLET | ORAL | Status: DC | PRN
  Administered 2017-06-17 – 2017-06-18 (×3): 650 mg via ORAL
  Filled 2017-06-17: qty 2

## 2017-06-17 MED ORDER — ONDANSETRON HCL 4 MG/2ML IJ SOLN
4.0000 mg | INTRAMUSCULAR | Status: DC | PRN
Start: 2017-06-17 — End: 2017-06-19

## 2017-06-17 MED ORDER — DIPHENHYDRAMINE HCL 25 MG PO CAPS: 25.0000 mg | ORAL_CAPSULE | Freq: Four times a day (QID) | ORAL | Status: DC | PRN

## 2017-06-17 MED ORDER — MEASLES, MUMPS & RUBELLA VAC ~~LOC~~ INJ
0.5000 mL | INJECTION | Freq: Once | SUBCUTANEOUS | Status: DC
  Filled 2017-06-17: qty 0.5

## 2017-06-17 MED ORDER — LIDOCAINE HCL (PF) 1 % IJ SOLN
INTRAMUSCULAR | Status: DC | PRN
  Administered 2017-06-17: 3 mL

## 2017-06-17 MED ORDER — LACTATED RINGERS IV SOLN: INTRAVENOUS | Status: DC

## 2017-06-17 MED ORDER — OXYTOCIN 40 UNITS IN LACTATED RINGERS INFUSION - SIMPLE MED
INTRAVENOUS | Status: AC
  Administered 2017-06-17: 2.5 [IU]/h via INTRAVENOUS
  Filled 2017-06-17: qty 1000

## 2017-06-17 MED ORDER — MISOPROSTOL 200 MCG PO TABS
ORAL_TABLET | ORAL | Status: AC
  Filled 2017-06-17: qty 4

## 2017-06-17 MED ORDER — BENZOCAINE-MENTHOL 20-0.5 % EX AERO: 1.0000 "application " | INHALATION_SPRAY | CUTANEOUS | Status: DC | PRN

## 2017-06-17 MED ORDER — ONDANSETRON HCL 4 MG/2ML IJ SOLN: 4.0000 mg | Freq: Four times a day (QID) | INTRAMUSCULAR | Status: DC | PRN

## 2017-06-17 MED ORDER — BISACODYL 10 MG RE SUPP: 10.0000 mg | Freq: Every day | RECTAL | Status: DC | PRN

## 2017-06-17 NOTE — Anesthesia Preprocedure Evaluation (Signed)
Anesthesia Evaluation  Patient identified by MRN, date of birth, ID band Patient awake    Reviewed: Allergy & Precautions, NPO status , Patient's Chart, lab work & pertinent test results  Airway Mallampati: II       Dental no notable dental hx.    Pulmonary Current Smoker,    Pulmonary exam normal        Cardiovascular negative cardio ROS Normal cardiovascular exam     Neuro/Psych negative neurological ROS  negative psych ROS   GI/Hepatic negative GI ROS, Neg liver ROS,   Endo/Other  negative endocrine ROS  Renal/GU negative Renal ROS  negative genitourinary   Musculoskeletal negative musculoskeletal ROS (+)   Abdominal Normal abdominal exam  (+)   Peds negative pediatric ROS (+)  Hematology negative hematology ROS (+)   Anesthesia Other Findings   Reproductive/Obstetrics (+) Pregnancy                             Anesthesia Physical Anesthesia Plan  ASA: II  Anesthesia Plan: Epidural   Post-op Pain Management:    Induction:   PONV Risk Score and Plan:   Airway Management Planned: Natural Airway  Additional Equipment:   Intra-op Plan:   Post-operative Plan:   Informed Consent: I have reviewed the patients History and Physical, chart, labs and discussed the procedure including the risks, benefits and alternatives for the proposed anesthesia with the patient or authorized representative who has indicated his/her understanding and acceptance.   Dental advisory given  Plan Discussed with: CRNA and Surgeon  Anesthesia Plan Comments:         Anesthesia Quick Evaluation

## 2017-06-17 NOTE — Anesthesia Procedure Notes (Signed)
Epidural Patient location during procedure: OB Start time: 06/17/2017 6:17 AM End time: 06/17/2017 6:29 AM  Staffing Anesthesiologist: Yves DillARROLL, Christi Wirick Performed: anesthesiologist   Preanesthetic Checklist Completed: patient identified, site marked, surgical consent, pre-op evaluation, timeout performed, IV checked, risks and benefits discussed and monitors and equipment checked  Epidural Patient position: sitting Prep: Betadine Patient monitoring: heart rate, continuous pulse ox and blood pressure Approach: midline Location: L3-L4 Injection technique: LOR air  Needle:  Needle type: Tuohy  Needle gauge: 18 G Needle length: 9 cm and 9 Catheter type: closed end flexible Catheter size: 20 Guage Test dose: negative and 1.5% lidocaine with Epi 1:200 K  Assessment Events: blood not aspirated, injection not painful, no injection resistance, negative IV test and no paresthesia  Additional Notes   Patient tolerated the insertion well without complications.                Time out called.  Patient placed in sitting position.  Back prepped and draped in sterile fashion.  A skin wheal was made in the L3-L4 interspace with 1% Lidocaine plain.  An 18G Tuohy needle was advanced to the epidural space by a loss of resistance technique.  No blood or paresthesias noted.  The epidural catheter was threaded 3 cm with a negative TD.  The patient tolerated the procedure well.  The catheter was affixed to the back in sterile fashion.    Reason for block:procedure for pain

## 2017-06-17 NOTE — H&P (Signed)
OB ADMISSION/ HISTORY & PHYSICAL:  Admission Date: 06/17/2017  4:04 AM  Admit Diagnosis: Active labor at term  Destiny Ferguson is a 36 y.o. female presenting for active labor, SROM for clear fluid at 0100.  Prenatal History: [id number]   EDC : 06/07/2017, by Patient Reported - however, MFM u/s at 30 weeks redated pregnancy because of discordant dates to EDD of 06/23/17, placing her at 39+1wks  Prenatal care at Primary Ob Provider: [facility] Prenatal course complicated by  - insufficient care, late entry to care at >25wks. Total of two visit this pregnancy, at 5529 and 32wks - AMA: declined prenatal testing - insufficient weight gain: BMI 17 at admission to care - bilateral hearing loss, does have hearing aid - GBS unknown (last visit at 32 wks)   Prenatal Labs: ABO, Rh: --/--/O POS (08/01 [id number]) Antibody: NEG (08/01 0456) Rubella:   immune RPR:   NR HBsAg:   negative HIV:   neg GTT: 97 GBS:   unknown  Medical / Surgical History :  Past medical history:  Past Medical History:  Diagnosis Date  . Hearing loss    Wears hearing aid  . Low grade squamous intraepithelial lesion (LGSIL) on cervical Pap smear      Past surgical history:  Past Surgical History:  Procedure Laterality Date  . THROAT SURGERY     Tonsilectomy    Family History:  Family History  Problem Relation Age of Onset  . Asthma Mother   . Cancer - Cervical Mother   . Hypertension Maternal Grandmother   . Heart disease Maternal Grandmother      Social History:  reports that she has been smoking Cigarettes.  She has been smoking about 0.25 packs per day. She has never used smokeless tobacco. She reports that she does not drink alcohol or use drugs.   Allergies: Patient has no known allergies.    Current Medications at time of admission:  Prior to Admission medications   Medication Sig Start Date End Date Taking? Authorizing Provider  Prenatal Vit-Fe Fumarate-FA (PRENATAL MULTIVITAMIN) TABS tablet Take 1  tablet by mouth daily at 12 noon. 03/23/15  Yes Schaevitz, Myra Rudeavid Matthew, MD     Review of Systems: Active FM onset of ctx @ 0100 currently every 3-5 minutes LOF  / SROM @ 0100   Physical Exam:  VS: Blood pressure 126/76, pulse 74, temperature 97.7 F (36.5 C), temperature source Oral, resp. rate 18, height 5\' 1"  (1.549 m), weight 115 lb (52.2 kg), unknown if currently breastfeeding.  General: alert and oriented, appears NAD Heart: RRR Lungs: Clear lung fields Abdomen: Gravid, soft and non-tender, non-distended  Extremities: trace edema  Genitalia / VE: Dilation: 6 Effacement (%): 90 Station: -1 Exam by:: [facility] RN  FHR: baseline rate 115 / variability mod / accelerations + / no decelerations TOCO: q3-4  Last US 30 wks 1516g (36%ile) placenta posterior, AF wnl, normal anatomy *Pregnancy is dated by this ultrasound, and redated her from an uncertain LMP of 08/31/16 (with that EDD 06/07/17) to an EDD of 06/23/17  Assessment: 39+[redacted] weeks gestation First stage of labor FHR category Cat I   Plan:  Admit for active labor Labs pending Epidural when desired Continuous fetal monitoring   1. Fetal Well being  - Fetal Tracing: Cat I - Ultrasound:  reviewed, as above - Group B Streptococcus: unknown- term pregnancy, ruptured <18hrs, will not start ppx - Presentation: vtx confirmed by sutures   2. Routine OB: - Prenatal labs reviewed, as above -  Rh positive

## 2017-06-18 LAB — CBC
HEMATOCRIT: 27.6 % — AB (ref 35.0–47.0)
Hemoglobin: 9.4 g/dL — ABNORMAL LOW (ref 12.0–16.0)
MCH: 26.5 pg (ref 26.0–34.0)
MCHC: 34.1 g/dL (ref 32.0–36.0)
MCV: 77.9 fL — ABNORMAL LOW (ref 80.0–100.0)
Platelets: 247 10*3/uL (ref 150–440)
RBC: 3.55 MIL/uL — ABNORMAL LOW (ref 3.80–5.20)
RDW: 15.4 % — AB (ref 11.5–14.5)
WBC: 8.9 10*3/uL (ref 3.6–11.0)

## 2017-06-18 LAB — RPR: RPR Ser Ql: NONREACTIVE

## 2017-06-18 NOTE — Lactation Note (Signed)
Lactation Consultation Note  Patient Name: Destiny Ferguson Reason for consult: Initial assessment   Maternal Data  Mother is getting engorged. Cabbage leave applied. Feeding      Gilman SchmidtCarolyn P Kathryn Cosby Ferguson, 2:41 PM

## 2017-06-18 NOTE — Anesthesia Postprocedure Evaluation (Signed)
Anesthesia Post Note  Patient: Destiny PetrinCasandra D Placide  Procedure(s) Performed: * No procedures listed *  Patient location during evaluation: Mother Baby Anesthesia Type: Epidural Level of consciousness: awake and alert Pain management: pain level controlled Vital Signs Assessment: post-procedure vital signs reviewed and stable Respiratory status: spontaneous breathing, nonlabored ventilation and respiratory function stable Cardiovascular status: stable Postop Assessment: no headache, no backache and epidural receding Anesthetic complications: no     Last Vitals:  Vitals:   06/18/17 0013 06/18/17 0436  BP: 110/70 117/79  Pulse: 68 94  Resp: 18 20  Temp: 36.6 C 36.7 C    Last Pain:  Vitals:   06/18/17 0616  TempSrc:   PainSc: Larena GlassmanAsleep                 Shavon Zenz,  Jinna Weinman A

## 2017-06-19 MED ORDER — FERROUS SULFATE 325 (65 FE) MG PO TABS
325.0000 mg | ORAL_TABLET | Freq: Every day | ORAL | Status: DC
  Administered 2017-06-19: 325 mg via ORAL
  Filled 2017-06-19: qty 1

## 2017-06-19 NOTE — Progress Notes (Addendum)
Post Partum Day 1 Subjective: Doing well, no complaints.  Tolerating regular diet, pain with PO meds, voiding and ambulating without difficulty.  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  Was not wearing it upon my entry to the room.  Objective: BP 115/84  Pulse 83   Temp 97.6 F (Oral)   Resp 16   Ht 5\' 1"  (1.549 m)   Wt 52.2 kg (115 lb)   SpO2 99%   Breastfeeding? no  BMI 21.73 kg/m    Physical Exam:  General: NAD CV: RRR Pulm: nl effort, CTABL Lochia: moderate Uterine Fundus: fundus firm and below umbilicus DVT Evaluation: no cords, ttp LEs    Recent Labs  06/17/17 0456 06/18/17 0454  HGB 12.0 9.4*  HCT 36.1 27.6*  WBC 13.1* 8.9  PLT 309 247    Assessment/Plan: 36 y.o. [id number] postpartum day # 1  1. S/p VAVD: doing well, continue routine post partum cares 2. Acute blood loss anemia: PO ferrous sulfate daily 3. Due to unknown GBS status and not treated (no risk factors) baby requisite to stay 48hrs for eval for infection.  Anticipate maternal d/c tomorrow. 4. Provide services for hearing impaired.   ----- Ranae Plumberhelsea Natajah Derderian, MD Attending Obstetrician and Gynecologist [facility]

## 2017-06-19 NOTE — Discharge Summary (Signed)
Obstetrical Discharge Summary  Patient Name: Destiny PetrinCasandra D Ferguson DOB: 10/13/1981 MRN: [id number]  Date of Admission: 06/17/2017 Date of Delivery:06/17/17 Delivered by: Christeen DouglasBethany Beasley, MD Date of Discharge: 06/19/2017  Primary OB: [facility]  L[facility]08[facility]31/[facility] Estimated Date of Delivery: 06/23/17 Gestational Age at Delivery: 6664w1d   Antepartum complications:   - insufficient care, late entry to care at >25wks. 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 Pregnancy 06/17/2017  . Postpartum care following vaginal delivery 11/08/2015  . Labor and delivery, indication for care 11/06/2015    Augmentation: none Complications: None Intrapartum complications/course: [id number] at 39+1wks by 30 week ultrasound with insufficient prenatal care presented in active labor, SROM at home for clear fluid. She progressed to second stage with an epidural. Her pushing effort seemed appropriate but no fetal descent was noted. Vacuum applied when fetal heart tones dropped to the 80s, baby in ROA position at +3 station and no forward movement with maternal efforts. 1 popoff, 2 pulls and baby delivered over intact perineum.  Date of Delivery: 06/17/17 Delivered By: Christeen DouglasBethany Beasley Delivery Type: vacuum, outlet Anesthesia: epidural Placenta: sponatneous Laceration: none Episiotomy: none Newborn Data: Live born female  Birth Weight: 6 lb 1.4 oz (2760 g) APGAR: 8, 9  Postpartum Procedures: none  Post partum course:  Patient had an uncomplicated postpartum course.  By time of discharge on PPD#2, her pain was controlled on oral pain medications; she had appropriate lochia and was ambulating, voiding without difficulty and  tolerating regular diet.  She was deemed stable for discharge to home.    Discharge Physical Exam:  BP 109/75 (BP Location: Right Arm)   Pulse 76   Temp 98.1 F (36.7 C) (Oral)   Resp 20   Ht 5\' 1"  (1.549 m)   Wt 52.2 kg (115 lb)   SpO2 100%   Breastfeeding? Unknown   BMI 21.73 kg/m   General: NAD CV: RRR Pulm: CTABL, nl effort ABD: s/nd/nt, fundus firm and below the umbilicus Lochia: moderate DVT Evaluation: LE non-ttp, no evidence of DVT on exam.  Hemoglobin  Date Value Ref Range Status  06/18/2017 9.4 (L) 12.0 - 16.0 g/dL Final   HGB  Date Value Ref Range Status  09/26/2014 15.0 12.0 - 16.0 g/dL Final   HCT  Date Value Ref Range Status  06/18/2017 27.6 (L) 35.0 - 47.0 % Final  09/26/2014 43.4 35.0 - 47.0 % Final     Disposition: stable, discharge to home. Baby Feeding: breastmilk Baby Disposition: home with mom  Rh Immune globulin given: n/a Rubella vaccine given: n/a Tdap vaccine given in AP or PP setting: unknown Flu vaccine given in AP or PP setting: n/a  Contraception: TBD  Prenatal Labs:   ABO, Rh: --/--/O POS (08/01 [id number]) Antibody: NEG (08/01 0456) Rubella:   immune RPR:   NR HBsAg:   negative HIV:   neg GTT: 97 GBS:   unknown  Plan:  Destiny Ferguson was discharged to home in good condition. Follow-up appointment with delivering provider in 6 weeks.  Discharge Medications: Allergies as of 06/19/2017   No Known Allergies     Medication List    TAKE these medications   prenatal multivitamin Tabs tablet Take  1 tablet by mouth daily at 12 noon.       Follow-up Information    Christeen DouglasBeasley, Bethany, MD Follow up in 6 week(s).   Specialty:  Obstetrics and Gynecology Contact information: [address] [place] [address] [phone]           Signed: ----- Ranae Plumberhelsea Ward, MD Attending Obstetrician and Gynecologist [facility], Department of OB/GYN [facility]

## 2017-06-19 NOTE — Discharge Instructions (Signed)

## 2017-06-19 NOTE — Progress Notes (Signed)
Pt discharged home with infant.  Discharge instructions and follow up appointment given to and reviewed with pt.  Pt verbalized understanding.  Escorted by auxillary. 

## 2019-06-06 ENCOUNTER — Emergency Department
Admission: EM | Admit: 2019-06-06 | Discharge: 2019-06-06 | Disposition: A | Discharge status: Home / Self Care | Payer: Self-pay | Attending: Emergency Medicine | Admitting: Emergency Medicine

## 2019-06-06 ENCOUNTER — Other Ambulatory Visit: Payer: Self-pay

## 2019-06-06 DIAGNOSIS — K529 Noninfective gastroenteritis and colitis, unspecified: Secondary | ICD-10-CM | POA: Insufficient documentation

## 2019-06-06 DIAGNOSIS — F17210 Nicotine dependence, cigarettes, uncomplicated: Secondary | ICD-10-CM | POA: Insufficient documentation

## 2019-06-06 LAB — COMPREHENSIVE METABOLIC PANEL
ALT: 20 U/L (ref 0–44)
AST: 22 U/L (ref 15–41)
Albumin: 4.8 g/dL (ref 3.5–5.0)
Alkaline Phosphatase: 72 U/L (ref 38–126)
Anion gap: 14 (ref 5–15)
BUN: 26 mg/dL — ABNORMAL HIGH (ref 6–20)
CO2: 33 mmol/L — ABNORMAL HIGH (ref 22–32)
Calcium: 10.1 mg/dL (ref 8.9–10.3)
Chloride: 87 mmol/L — ABNORMAL LOW (ref 98–111)
Creatinine, Ser: 0.78 mg/dL (ref 0.44–1.00)
GFR calc Af Amer: >60 mL/min (ref >60)
GFR calc non Af Amer: >60 mL/min (ref >60)
Glucose, Bld: 156 mg/dL — ABNORMAL HIGH (ref 70–99)
Potassium: 3 mmol/L — ABNORMAL LOW (ref 3.5–5.1)
Sodium: 134 mmol/L — ABNORMAL LOW (ref 135–145)
Total Bilirubin: 0.5 mg/dL (ref 0.3–1.2)
Total Protein: 8.3 g/dL — ABNORMAL HIGH (ref 6.5–8.1)

## 2019-06-06 LAB — URINALYSIS, COMPLETE (UACMP) WITH MICROSCOPIC
Bacteria, UA: NONE SEEN
Bilirubin Urine: NEGATIVE
Glucose, UA: NEGATIVE mg/dL
Ketones, ur: 5 mg/dL — AB
Leukocytes,Ua: NEGATIVE
Nitrite: NEGATIVE
Protein, ur: >=300 mg/dL — AB
RBC / HPF: >50 RBC/hpf — ABNORMAL HIGH (ref 0–5)
Specific Gravity, Urine: 1.024 (ref 1.005–1.030)
pH: 9 — ABNORMAL HIGH (ref 5.0–8.0)

## 2019-06-06 LAB — CBC
HCT: 42.9 % (ref 36.0–46.0)
Hemoglobin: 14.4 g/dL (ref 12.0–15.0)
MCH: 28.6 pg (ref 26.0–34.0)
MCHC: 33.6 g/dL (ref 30.0–36.0)
MCV: 85.1 fL (ref 80.0–100.0)
Platelets: 383 10*3/uL (ref 150–400)
RBC: 5.04 MIL/uL (ref 3.87–5.11)
RDW: 13.2 % (ref 11.5–15.5)
WBC: 18.7 10*3/uL — ABNORMAL HIGH (ref 4.0–10.5)
nRBC: 0 % (ref 0.0–0.2)

## 2019-06-06 LAB — URINE DRUG SCREEN, QUALITATIVE (ARMC ONLY)
Amphetamines, Ur Screen: NOT DETECTED
Barbiturates, Ur Screen: NOT DETECTED
Benzodiazepine, Ur Scrn: NOT DETECTED
Cannabinoid 50 Ng, Ur ~~LOC~~: POSITIVE — AB
Cocaine Metabolite,Ur ~~LOC~~: NOT DETECTED
MDMA (Ecstasy)Ur Screen: NOT DETECTED
Methadone Scn, Ur: NOT DETECTED
Opiate, Ur Screen: NOT DETECTED
Phencyclidine (PCP) Ur S: NOT DETECTED
Tricyclic, Ur Screen: NOT DETECTED

## 2019-06-06 LAB — POC URINE PREG, ED: Preg Test, Ur: NEGATIVE

## 2019-06-06 LAB — LIPASE, BLOOD: Lipase: 23 U/L (ref 11–51)

## 2019-06-06 MED ORDER — ONDANSETRON 4 MG PO TBDP
4.0000 mg | ORAL_TABLET | Freq: Once | ORAL | Status: AC | PRN
  Administered 2019-06-06: 4 mg via ORAL
  Filled 2019-06-06: qty 1

## 2019-06-06 MED ORDER — SODIUM CHLORIDE 0.9 % IV BOLUS
1000.0000 mL | Freq: Once | INTRAVENOUS | Status: AC
  Administered 2019-06-06: 1000 mL via INTRAVENOUS

## 2019-06-06 MED ORDER — HALOPERIDOL LACTATE 5 MG/ML IJ SOLN
2.5000 mg | Freq: Once | INTRAMUSCULAR | Status: AC
  Administered 2019-06-06: 2.5 mg via INTRAVENOUS
  Filled 2019-06-06: qty 1

## 2019-06-06 MED ORDER — ONDANSETRON HCL 4 MG PO TABS: 4.0000 mg | ORAL_TABLET | Freq: Three times a day (TID) | ORAL | 0 refills | Status: DC | PRN

## 2019-06-06 MED ORDER — SODIUM CHLORIDE 0.9 % IV BOLUS: 500.0000 mL | Freq: Once | INTRAVENOUS | Status: DC

## 2019-06-06 NOTE — ED Provider Notes (Addendum)
[facility] Emergency Department Provider Note  ____________________________________________   I have reviewed the triage vital signs and the nursing notes. Where available I have reviewed prior notes and, if possible and indicated, outside hospital notes.    HISTORY  Chief Complaint Nausea and Emesis  Patient seen and evaluated during the coronavirus epidemic during a time with low staffing  HPI Destiny Ferguson is a 38 y.o. female with a history of no significant problems on no medications, denies recreational drug use or alcohol abuse, denies pregnancy, states she is been having painless nausea and vomiting for last couple days as well as some loose stools.  Denies any fever or chills.  No focal abdominal discomfort.  No melena no bright red blood per rectum no hematemesis, minimal diarrhea.  But loose stools.  She denies any discomfort with this.  She states she feels somewhat dehydrated.  Positive sick contacts.  No fever.    Past Medical History:  Diagnosis Date  . Hearing loss    Wears hearing aid  . Low grade squamous intraepithelial lesion (LGSIL) on cervical Pap smear     Patient Active Problem List   Diagnosis Date Noted  . Pregnancy 06/17/2017  . Postpartum care following vaginal delivery 11/08/2015  . Labor and delivery, indication for care 11/06/2015    Past Surgical History:  Procedure Laterality Date  . THROAT SURGERY     Tonsilectomy    Prior to Admission medications   Medication Sig Start Date End Date Taking? Authorizing Provider  Prenatal Vit-Fe Fumarate-FA (PRENATAL MULTIVITAMIN) TABS tablet Take 1 tablet by mouth daily at 12 noon. 03/23/15   Schaevitz, Myra Rudeavid Matthew, MD    Allergies Patient has no known allergies.  Family History  Problem Relation Age of Onset  . Asthma Mother   . Cancer - Cervical Mother   . Hypertension Maternal Grandmother   . Heart disease Maternal Grandmother     Social History Social History    Tobacco Use  . Smoking status: Current Every Day Smoker    Packs/day: 0.25    Types: Cigarettes  . Smokeless tobacco: Never Used  Substance Use Topics  . Alcohol use: No  . Drug use: No    Review of Systems Constitutional: No fever/chills Eyes: No visual changes. ENT: No sore throat. No stiff neck no neck pain Cardiovascular: Denies chest pain. Respiratory: Denies shortness of breath. Gastrointestinal: See HPI Genitourinary: Negative for dysuria. Musculoskeletal: Negative lower extremity swelling Skin: Negative for rash. Neurological: Negative for severe headaches, focal weakness or numbness.   ____________________________________________   PHYSICAL EXAM:  VITAL SIGNS: ED Triage Vitals [06/06/19 1326]  Enc Vitals Group     BP 114/80     Pulse Rate 70     Resp 18     Temp 98.7 F (37.1 C)     Temp Source Oral     SpO2 100 %     Weight 86 lb (39 kg)     Height 5\' 1"  (1.549 m)     Head Circumference      Peak Flow      Pain Score 0     Pain Loc      Pain Edu?      Excl. in GC?     Constitutional: Alert and oriented. Well appearing and in no acute distress. Eyes: Conjunctivae are normal Head: Atraumatic HEENT: No congestion/rhinnorhea. Mucous membranes are dry.  Oropharynx non-erythematous Neck:   Nontender with no meningismus, no masses, no stridor Cardiovascular:  Normal rate, regular rhythm. Grossly normal heart sounds.  Good peripheral circulation. Respiratory: Normal respiratory effort.  No retractions. Lungs CTAB. Abdominal: Soft and nontender. No distention. No guarding no rebound Back:  There is no focal tenderness or step off.  there is no midline tenderness there are no lesions noted. there is no CVA tenderness Musculoskeletal: No lower extremity tenderness, no upper extremity tenderness. No joint effusions, no DVT signs strong distal pulses no edema Neurologic:  Normal speech and language. No gross focal neurologic deficits are appreciated.  Skin:   Skin is warm, dry and intact. No rash noted. Psychiatric: Mood and affect are normal. Speech and behavior are normal.  ____________________________________________   LABS (all labs ordered are listed, but only abnormal results are displayed)  Labs Reviewed  COMPREHENSIVE METABOLIC PANEL - Abnormal; Notable for the following components:      Result Value   Sodium 134 (*)    Potassium 3.0 (*)    Chloride 87 (*)    CO2 33 (*)    Glucose, Bld 156 (*)    BUN 26 (*)    Total Protein 8.3 (*)    All other components within normal limits  CBC - Abnormal; Notable for the following components:   WBC 18.7 (*)    All other components within normal limits  LIPASE, BLOOD  URINALYSIS, COMPLETE (UACMP) WITH MICROSCOPIC  POC URINE PREG, ED    Pertinent labs  results that were available during my care of the patient were reviewed by me and considered in my medical decision making (see chart for details). ____________________________________________  EKG  I personally interpreted any EKGs ordered by me or triage  ____________________________________________  RADIOLOGY  Pertinent labs & imaging results that were available during my care of the patient were reviewed by me and considered in my medical decision making (see chart for details). If possible, patient and/or family made aware of any abnormal findings.  No results found. ____________________________________________    PROCEDURES  Procedure(s) performed: None  Procedures  Critical Care performed: None  ____________________________________________   INITIAL IMPRESSION / ASSESSMENT AND PLAN / ED COURSE  Pertinent labs & imaging results that were available during my care of the patient were reviewed by me and considered in my medical decision making (see chart for details).  Patient here with a gastrointestinal illness involving vomiting and loose stools for the last couple days.  Abdomen is benign on serial exams here.   Does appear to be somewhat dry, creatinine is preserved with BUN is elevated suggesting dehydration we will give her IV fluids, and reassess.  White count is nonspecific.  Hyperemesis is certainly possible in this patient she denies pregnancy we will check a pregnancy test.  ----------------------------------------- 5:56 PM on 06/06/2019 -----------------------------------------  Patient states she feels much better has not vomited here, did have some nausea here we have given her fluids, there is trace ketones, we have given her copious IV fluids and she would like to go home.  We will test p.o.  ----------------------------------------- 6:22 PM on 06/06/2019 -----------------------------------------   Pt desirous of discharge.  States she feels 100% better serial abdominal exams are benign, she is offering me her arm to pull out the IV because she wants to leave "right now".  We are giving her p.o. challenge if she can tolerate that we will discharge her. return  Precautions follow-up given and understood.  Patient understands he must not drive.    ____________________________________________   FINAL CLINICAL IMPRESSION(S) / ED DIAGNOSES  Final diagnoses:  None      This chart was dictated using voice recognition software.  Despite best efforts to proofread,  errors can occur which can change meaning.      Schuyler Amor, MD 06/06/19 1606    Schuyler Amor, MD 06/06/19 1757    Schuyler Amor, MD 06/06/19 Vernelle Emerald

## 2019-06-06 NOTE — ED Notes (Signed)
Pt discharged home after verbalizing understanding of discharge instructions; nad noted. 

## 2019-06-06 NOTE — ED Triage Notes (Signed)
Nausea and emesis X 3 days. Denies abdominal pain or diarrhea. Fatigue. No emesis at this time of triage. Pt alert and oriented X4, active, cooperative, pt in NAD. RR even and unlabored, color WNL.

## 2022-02-26 ENCOUNTER — Ambulatory Visit: Payer: Self-pay | Admitting: Gerontology

## 2022-04-15 ENCOUNTER — Emergency Department
Admission: EM | Admit: 2022-04-15 | Discharge: 2022-04-15 | Disposition: A | Discharge status: Home / Self Care | Payer: Self-pay | Attending: Emergency Medicine | Admitting: Emergency Medicine

## 2022-04-15 ENCOUNTER — Other Ambulatory Visit: Payer: Self-pay

## 2022-04-15 DIAGNOSIS — J069 Acute upper respiratory infection, unspecified: Secondary | ICD-10-CM | POA: Insufficient documentation

## 2022-04-15 DIAGNOSIS — Z20822 Contact with and (suspected) exposure to covid-19: Secondary | ICD-10-CM | POA: Insufficient documentation

## 2022-04-15 LAB — SARS CORONAVIRUS 2 BY RT PCR: SARS Coronavirus 2 by RT PCR: NEGATIVE

## 2022-04-15 MED ORDER — BENZONATATE 100 MG PO CAPS: 100.0000 mg | ORAL_CAPSULE | Freq: Three times a day (TID) | ORAL | 0 refills | Status: DC | PRN

## 2022-04-15 MED ORDER — AMOXICILLIN 875 MG PO TABS: 875.0000 mg | ORAL_TABLET | Freq: Two times a day (BID) | ORAL | 0 refills | Status: DC

## 2022-04-15 NOTE — ED Triage Notes (Signed)
Pt comes with c/o cough, runny nose for few days. Pt states productive cough.

## 2022-04-15 NOTE — ED Provider Notes (Signed)
   [facility] Provider Note    Event Date/Time   First MD Initiated Contact with Patient 04/15/22 1449     (approximate)   History   Cough   HPI  Destiny Ferguson is a 41 y.o. female presents emergency department with cough and congestion with runny nose and productive cough with yellow mucus.  No fever or chills.  Symptoms for 2 days.  Patient denies chest pain or shortness of breath.  States basically she needs a work note.      Physical Exam   Triage Vital Signs: ED Triage Vitals  Enc Vitals Group     BP 04/15/22 1334 119/79     Pulse Rate 04/15/22 1334 (!) 101     Resp 04/15/22 1334 19     Temp 04/15/22 1334 99.1 F (37.3 C)     Temp src --      SpO2 04/15/22 1334 95 %     Weight --      Height --      Head Circumference --      Peak Flow --      Pain Score 04/15/22 1333 0     Pain Loc --      Pain Edu? --      Excl. in [place]? --     Most recent vital signs: Vitals:   04/15/22 1334  BP: 119/79  Pulse: (!) 101  Resp: 19  Temp: 99.1 F (37.3 C)  SpO2: 95%     General: Awake, no distress.   CV:  Good peripheral perfusion. regular rate and  rhythm Resp:  Normal effort. Lungs CTA Abd:  No distention.   Other:      ED Results / Procedures / Treatments   Labs (all labs ordered are listed, but only abnormal results are displayed) Labs Reviewed  SARS CORONAVIRUS 2 BY RT PCR     EKG     RADIOLOGY     PROCEDURES:   Procedures   MEDICATIONS ORDERED IN ED: Medications - No data to display   IMPRESSION / MDM / [place] / ED COURSE  I reviewed the triage vital signs and the nursing notes.                              Differential diagnosis includes, but is not limited to, COVID, URI, bronchitis, CAP  Less likely CAP as patient is afebrile.  COVID/influenza test is negative  We will give the patient a prescription for amoxicillin and Tessalon Perles.  She is given a work note.  Discharged stable  condition.  Patient's presentation is most consistent with acute, uncomplicated illness.        FINAL CLINICAL IMPRESSION(S) / ED DIAGNOSES   Final diagnoses:  Acute URI     Rx / DC Orders   ED Discharge Orders          Ordered    amoxicillin (AMOXIL) 875 MG tablet  2 times daily        04/15/22 1453    benzonatate (TESSALON PERLES) 100 MG capsule  3 times daily PRN        04/15/22 1453             Note:  This document was prepared using Dragon voice recognition software and may include unintentional dictation errors.    Versie Starks, PA-C 04/15/22 1613    Harvest Dark, MD 04/17/22 [phone]

## 2022-08-06 ENCOUNTER — Emergency Department: Payer: Self-pay

## 2022-08-06 ENCOUNTER — Other Ambulatory Visit: Payer: Self-pay

## 2022-08-06 ENCOUNTER — Encounter: Payer: Self-pay | Admitting: Emergency Medicine

## 2022-08-06 ENCOUNTER — Emergency Department
Admission: EM | Admit: 2022-08-06 | Discharge: 2022-08-06 | Disposition: A | Discharge status: Home / Self Care | Payer: Self-pay | Attending: Emergency Medicine | Admitting: Emergency Medicine

## 2022-08-06 DIAGNOSIS — R112 Nausea with vomiting, unspecified: Secondary | ICD-10-CM | POA: Insufficient documentation

## 2022-08-06 DIAGNOSIS — D72829 Elevated white blood cell count, unspecified: Secondary | ICD-10-CM | POA: Insufficient documentation

## 2022-08-06 DIAGNOSIS — Z20822 Contact with and (suspected) exposure to covid-19: Secondary | ICD-10-CM | POA: Insufficient documentation

## 2022-08-06 DIAGNOSIS — K2970 Gastritis, unspecified, without bleeding: Secondary | ICD-10-CM

## 2022-08-06 DIAGNOSIS — R000 Tachycardia, unspecified: Secondary | ICD-10-CM | POA: Insufficient documentation

## 2022-08-06 LAB — COMPREHENSIVE METABOLIC PANEL
ALT: 16 U/L (ref 0–44)
AST: 20 U/L (ref 15–41)
Albumin: 4.3 g/dL (ref 3.5–5.0)
Alkaline Phosphatase: 80 U/L (ref 38–126)
Anion gap: 11 (ref 5–15)
BUN: 29 mg/dL — ABNORMAL HIGH (ref 6–20)
CO2: 27 mmol/L (ref 22–32)
Calcium: 9.7 mg/dL (ref 8.9–10.3)
Chloride: 103 mmol/L (ref 98–111)
Creatinine, Ser: 0.72 mg/dL (ref 0.44–1.00)
GFR, Estimated: >60 mL/min (ref >60)
Glucose, Bld: 161 mg/dL — ABNORMAL HIGH (ref 70–99)
Potassium: 3.7 mmol/L (ref 3.5–5.1)
Sodium: 141 mmol/L (ref 135–145)
Total Bilirubin: 0.8 mg/dL (ref 0.3–1.2)
Total Protein: 8.2 g/dL — ABNORMAL HIGH (ref 6.5–8.1)

## 2022-08-06 LAB — LACTIC ACID, PLASMA: Lactic Acid, Venous: 1.6 mmol/L (ref 0.5–1.9)

## 2022-08-06 LAB — CBC
HCT: 43.7 % (ref 36.0–46.0)
Hemoglobin: 13.9 g/dL (ref 12.0–15.0)
MCH: 27.4 pg (ref 26.0–34.0)
MCHC: 31.8 g/dL (ref 30.0–36.0)
MCV: 86.2 fL (ref 80.0–100.0)
Platelets: 330 10*3/uL (ref 150–400)
RBC: 5.07 MIL/uL (ref 3.87–5.11)
RDW: 14.4 % (ref 11.5–15.5)
WBC: 23.2 10*3/uL — ABNORMAL HIGH (ref 4.0–10.5)
nRBC: 0 % (ref 0.0–0.2)

## 2022-08-06 LAB — URINALYSIS, ROUTINE W REFLEX MICROSCOPIC
Bacteria, UA: NONE SEEN
Bilirubin Urine: NEGATIVE
Glucose, UA: NEGATIVE mg/dL
Ketones, ur: 80 mg/dL — AB
Nitrite: NEGATIVE
Protein, ur: 100 mg/dL — AB
RBC / HPF: >50 RBC/hpf — ABNORMAL HIGH (ref 0–5)
Specific Gravity, Urine: 1.032 — ABNORMAL HIGH (ref 1.005–1.030)
pH: 5 (ref 5.0–8.0)

## 2022-08-06 LAB — CBC WITH DIFFERENTIAL/PLATELET
Abs Immature Granulocytes: 0.15 10*3/uL — ABNORMAL HIGH (ref 0.00–0.07)
Basophils Absolute: 0.1 10*3/uL (ref 0.0–0.1)
Basophils Relative: 0 %
Eosinophils Absolute: 0 10*3/uL (ref 0.0–0.5)
Eosinophils Relative: 0 %
HCT: 43.3 % (ref 36.0–46.0)
Hemoglobin: 14 g/dL (ref 12.0–15.0)
Immature Granulocytes: 1 %
Lymphocytes Relative: 6 %
Lymphs Abs: 1.4 10*3/uL (ref 0.7–4.0)
MCH: 28.1 pg (ref 26.0–34.0)
MCHC: 32.3 g/dL (ref 30.0–36.0)
MCV: 86.9 fL (ref 80.0–100.0)
Monocytes Absolute: 1.2 10*3/uL — ABNORMAL HIGH (ref 0.1–1.0)
Monocytes Relative: 5 %
Neutro Abs: 20.2 10*3/uL — ABNORMAL HIGH (ref 1.7–7.7)
Neutrophils Relative %: 88 %
Platelets: 330 10*3/uL (ref 150–400)
RBC: 4.98 MIL/uL (ref 3.87–5.11)
RDW: 14.5 % (ref 11.5–15.5)
WBC: 23 10*3/uL — ABNORMAL HIGH (ref 4.0–10.5)
nRBC: 0 % (ref 0.0–0.2)

## 2022-08-06 LAB — RESP PANEL BY RT-PCR (FLU A&B, COVID) ARPGX2
Influenza A by PCR: NEGATIVE
Influenza B by PCR: NEGATIVE
SARS Coronavirus 2 by RT PCR: NEGATIVE

## 2022-08-06 LAB — POC URINE PREG, ED: Preg Test, Ur: NEGATIVE

## 2022-08-06 LAB — LIPASE, BLOOD: Lipase: 24 U/L (ref 11–51)

## 2022-08-06 LAB — PROCALCITONIN: Procalcitonin: <0.1 ng/mL

## 2022-08-06 MED ORDER — SODIUM CHLORIDE 0.9 % IV BOLUS
1000.0000 mL | Freq: Once | INTRAVENOUS | Status: AC
  Administered 2022-08-06: 1000 mL via INTRAVENOUS

## 2022-08-06 MED ORDER — ONDANSETRON 4 MG PO TBDP: 4.0000 mg | ORAL_TABLET | Freq: Three times a day (TID) | ORAL | 0 refills | Status: AC | PRN

## 2022-08-06 MED ORDER — LACTATED RINGERS IV BOLUS
1000.0000 mL | Freq: Once | INTRAVENOUS | Status: AC
  Administered 2022-08-06: 1000 mL via INTRAVENOUS

## 2022-08-06 MED ORDER — ONDANSETRON HCL 4 MG/2ML IJ SOLN
4.0000 mg | Freq: Once | INTRAMUSCULAR | Status: AC
  Administered 2022-08-06: 4 mg via INTRAVENOUS
  Filled 2022-08-06: qty 2

## 2022-08-06 NOTE — ED Provider Notes (Addendum)
[facility] Provider Note    Event Date/Time   First MD Initiated Contact with Patient 08/06/22 1337     (approximate)   History   Emesis   HPI  Destiny Ferguson is a 41 y.o. female who is otherwise healthy who comes in with concerns for emesis.  Patient reports that both of her children were sick the past couple of days but then she feels like she got it from them.  She has now had about 48 hours of significant nausea and vomiting.  She reports that when she has been sick previously she sometimes gets this.  She denies any diarrhea or abdominal pain.  She denies any weight loss, headaches or weakness on one side of her body.  She denies any new shortness of breath or chest pain.  Physical Exam   Triage Vital Signs: ED Triage Vitals  Enc Vitals Group     BP 08/06/22 1206 (!) 111/95     Pulse Rate 08/06/22 1206 (!) 118     Resp 08/06/22 1206 20     Temp 08/06/22 1206 98.3 F (36.8 C)     Temp Source 08/06/22 1206 Oral     SpO2 08/06/22 1206 94 %     Weight 08/06/22 1200 85 lb 15.7 oz (39 kg)     Height 08/06/22 1200 5\' 1"  (1.549 m)     Head Circumference --      Peak Flow --      Pain Score 08/06/22 1200 0     Pain Loc --      Pain Edu? --      Excl. in GC? --     Most recent vital signs: Vitals:   08/06/22 1206  BP: (!) 111/95  Pulse: (!) 118  Resp: 20  Temp: 98.3 F (36.8 C)  SpO2: 94%     General: Awake, no distress.  CV:  Good peripheral perfusion.  Tachycardic.  No chest wall tenderness Resp:  Normal effort.  Abd:  No distention.  Soft and nontender Other:     ED Results / Procedures / Treatments   Labs (all labs ordered are listed, but only abnormal results are displayed) Labs Reviewed  COMPREHENSIVE METABOLIC PANEL - Abnormal; Notable for the following components:      Result Value   Glucose, Bld 161 (*)    BUN 29 (*)    Total Protein 8.2 (*)    All other components within normal limits  CBC - Abnormal; Notable for  the following components:   WBC 23.2 (*)    All other components within normal limits  URINALYSIS, ROUTINE W REFLEX MICROSCOPIC - Abnormal; Notable for the following components:   Color, Urine YELLOW (*)    APPearance CLOUDY (*)    Specific Gravity, Urine 1.032 (*)    Hgb urine dipstick LARGE (*)    Ketones, ur 80 (*)    Protein, ur 100 (*)    Leukocytes,Ua TRACE (*)    RBC / HPF >50 (*)    All other components within normal limits  LIPASE, BLOOD  POC URINE PREG, ED     RADIOLOGY I have reviewed the xray personally and  interpretted and no PNA    PROCEDURES:  Critical Care performed: No  .1-3 Lead EKG Interpretation  Performed by: Concha Se, MD Authorized by: Concha Se, MD     Interpretation: normal     ECG rate:  70   ECG rate assessment: normal  Rhythm: sinus rhythm     Ectopy: none     Conduction: normal      MEDICATIONS ORDERED IN ED: Medications  sodium chloride 0.9 % bolus 1,000 mL (1,000 mLs Intravenous New Bag/Given 08/06/22 1415)  ondansetron (ZOFRAN) injection 4 mg (4 mg Intravenous Given 08/06/22 1416)  lactated ringers bolus 1,000 mL (1,000 mLs Intravenous New Bag/Given 08/06/22 1415)     IMPRESSION / MDM / ASSESSMENT AND PLAN / ED COURSE  I reviewed the triage vital signs and the nursing notes.   Patient's presentation is most consistent with acute presentation with potential threat to life or bodily function.   Patient comes in tachycardic with nausea and vomiting.  Differential given the elevated white count is abdominal pathology but her abdomen is soft and nontender so we we will hold off on any CT imaging.  She denies any headaches weight loss or neurological symptoms to suggest a brain mass and given the positive sick contacts this seems more likely a viral illness.  I suspect the elevated white count is more likely reactive from all the nausea and vomiting we will get differential, procalcitonin, lactate, blood cultures to ensure that  there is nothing bacterial going on.  We will give fluids and Zofran to help with symptoms.  Pregnancy test was negative.  Lipase reassuring.  CMP slightly elevated glucose but anion gap was normal.  CBC shows white count of 23,000.  Urine shows evidence of RBCs and some dehydration.  Patient does have RBCs in her urine but she reports that she is currently menstruating denies any urinary symptoms or flank pain to suggest kidney stones.  2:34 PM reevaluated patient abdomen remains soft and nontender.  Patient and I discussed about CT imaging given elevated white count but given she is nontender and she reports that she is sick from her children with similar GI symptoms she would like to hold off.  However she understands that if she develops pain to please let the next provider know.  Patient will be handed off to oncoming provider pending the rest of the blood work, p.o. challenge   3:29 PM reevaluated patient continues to have no abdominal pain.  Patient requesting discharge home reporting that she is feeling much better.  She is got 2 L of fluid and her work-up was otherwise reassuring.  Her lactate was normal procalcitonin was negative.  She is got a blood culture in process.  I suspect this is more likely a viral process than a bacterial process but she understands that if her blood culture is positive that we would have to call her back to the hospital but at this time I do not see any evidence of anything that would need to be started on antibiotics given I suspect this is most likely gastritis and patient is requesting discharge home.  Pt tolerating PO./  The patient is on the cardiac monitor to evaluate for evidence of arrhythmia and/or significant heart rate changes.      FINAL CLINICAL IMPRESSION(S) / ED DIAGNOSES   Final diagnoses:  Nausea and vomiting, unspecified vomiting type     Rx / DC Orders   ED Discharge Orders     None        Note:  This document was prepared using  Dragon voice recognition software and may include unintentional dictation errors.   Vanessa [place], MD 08/06/22 1435    Vanessa , MD 08/06/22 [phone]

## 2022-08-06 NOTE — ED Triage Notes (Signed)
C/O N/V x 2 days.  States had some chills after vomiting.    AAOx3.  Skin warm and dry. NAD

## 2022-08-06 NOTE — Discharge Instructions (Addendum)
We have opted to hold off on any CT imaging given you had no abdominal pain but if you develop abdominal pain you can return to the ER.  Your white count is elevated which is a marker of infection but we did a test that looks for a bacterial infection was negative.  Therefore I suspect this is most likely a viral infection.  Return to the ER though if he develop any worsening symptoms or any other concerns

## 2022-08-07 LAB — URINE CULTURE: Culture: NO GROWTH

## 2022-08-11 LAB — CULTURE, BLOOD (ROUTINE X 2)
Culture: NO GROWTH
Culture: NO GROWTH
Special Requests: ADEQUATE
Special Requests: ADEQUATE

## 2022-10-01 ENCOUNTER — Emergency Department: Payer: Self-pay

## 2022-10-01 ENCOUNTER — Other Ambulatory Visit: Payer: Self-pay

## 2022-10-01 ENCOUNTER — Emergency Department
Admission: EM | Admit: 2022-10-01 | Discharge: 2022-10-01 | Disposition: A | Discharge status: Home / Self Care | Payer: Self-pay | Attending: Emergency Medicine | Admitting: Emergency Medicine

## 2022-10-01 DIAGNOSIS — R051 Acute cough: Secondary | ICD-10-CM

## 2022-10-01 DIAGNOSIS — R509 Fever, unspecified: Secondary | ICD-10-CM | POA: Insufficient documentation

## 2022-10-01 DIAGNOSIS — R1110 Vomiting, unspecified: Secondary | ICD-10-CM | POA: Insufficient documentation

## 2022-10-01 DIAGNOSIS — Z1152 Encounter for screening for COVID-19: Secondary | ICD-10-CM | POA: Insufficient documentation

## 2022-10-01 LAB — RESP PANEL BY RT-PCR (FLU A&B, COVID) ARPGX2
Influenza A by PCR: NEGATIVE
Influenza B by PCR: NEGATIVE
SARS Coronavirus 2 by RT PCR: NEGATIVE

## 2022-10-01 MED ORDER — ALBUTEROL SULFATE HFA 108 (90 BASE) MCG/ACT IN AERS: 2.0000 | INHALATION_SPRAY | Freq: Four times a day (QID) | RESPIRATORY_TRACT | 2 refills | Status: AC | PRN

## 2022-10-01 MED ORDER — BENZONATATE 100 MG PO CAPS: 100.0000 mg | ORAL_CAPSULE | Freq: Three times a day (TID) | ORAL | 0 refills | Status: AC | PRN

## 2022-10-01 NOTE — ED Provider Notes (Signed)
[facility] Provider Note    Event Date/Time   First MD Initiated Contact with Patient 10/01/22 1219     (approximate)   History   Chief Complaint Cough and Emesis   HPI Destiny Ferguson is a 41 y.o. female, no significant medical history, presents to the emergency department for evaluation of cough/emesis.  Patient states that she has been feeling well for the past few days, but endorses posttussive emesis yesterday.  She states that she feels like congestion in her lungs as well.  And, reports fever of over 100 yesterday.  Denies myalgias, chest pain, shortness of breath, abdominal pain, flank pain, diarrhea, hematemesis, hematuria, headache, rash/lesions, or dizziness/lightheadedness.  History Limitations: No limitations.        Physical Exam  Triage Vital Signs: ED Triage Vitals  Enc Vitals Group     BP 10/01/22 1209 115/82     Pulse Rate 10/01/22 1209 97     Resp 10/01/22 1209 18     Temp 10/01/22 1209 98.7 F (37.1 C)     Temp src --      SpO2 10/01/22 1209 99 %     Weight --      Height --      Head Circumference --      Peak Flow --      Pain Score 10/01/22 1159 0     Pain Loc --      Pain Edu? --      Excl. in GC? --     Most recent vital signs: Vitals:   10/01/22 1209 10/01/22 1425  BP: 115/82 116/86  Pulse: 97 94  Resp: 18 18  Temp: 98.7 F (37.1 C) 98.5 F (36.9 C)  SpO2: 99% 99%    General: Awake, NAD.  Audible productive cough on exam. Skin: Warm, dry. No rashes or lesions.  Eyes: PERRL. Conjunctivae normal.  CV: Good peripheral perfusion.  Resp: Normal effort.  Mild rhonchi appreciated in the bases bilaterally. Abd: Soft, non-tender. No distention.  Neuro: At baseline. No gross neurological deficits.  Musculoskeletal: Normal ROM of all extremities.  Physical Exam    ED Results / Procedures / Treatments  Labs (all labs ordered are listed, but only abnormal results are displayed) Labs Reviewed  RESP PANEL  BY RT-PCR (FLU A&B, COVID) [id number]     EKG N/A.    RADIOLOGY  ED Provider Interpretation: I personally reviewed and interpreted this x-ray, no evidence of pneumonia.  DG Chest 2 View  Result Date: 10/01/2022 CLINICAL DATA:  Vomiting and cough EXAM: CHEST - 2 VIEW COMPARISON:  08/06/22 CXR FINDINGS: No pleural effusion. No pneumothorax. Unchanged cardiac and mediastinal contours. Pulmonary hyperinflation. No focal airspace opacity. Displaced rib fractures. Vertebral body heights are maintained. Visualized upper abdomen is unremarkable. IMPRESSION: No focal airspace opacity. Electronically Signed   By: Lorenza Cambridge M.D.   On: 10/01/2022 13:17    PROCEDURES:  Critical Care performed: N/A.  Procedures    MEDICATIONS ORDERED IN ED: Medications - No data to display   IMPRESSION / MDM / ASSESSMENT AND PLAN / ED COURSE  I reviewed the triage vital signs and the nursing notes.                              Differential diagnosis includes, but is not limited to, COVID-19, influenza, community acquired pneumonia, bronchitis, viral URI.  Assessment/Plan Presentation consistent with lower respiratory infection, likely viral  in etiology.  Chest x-ray does not show any evidence of pneumonia at this time.  She appears well clinically, vitals are within normal limits.  Her respiratory panel was negative for COVID-19 or influenza.  She does have some mild rhonchi in the bases bilaterally, we will provide her with a prescription for albuterol to help with her symptoms, as well as benzonatate for the cough.  Encouraged her to continue taking Mucinex.  However, otherwise reassured her that this will likely resolve on its own with time.  Patient expressed understanding and agreed.  Recommend that she follow-up with her primary care provider as needed.  Will discharge.  Provided the patient with anticipatory guidance, return precautions, and educational material. Encouraged the patient to return to  the emergency department at any time if they begin to experience any new or worsening symptoms. Patient expressed understanding and agreed with the plan.   Patient's presentation is most consistent with acute complicated illness / injury requiring diagnostic workup.       FINAL CLINICAL IMPRESSION(S) / ED DIAGNOSES   Final diagnoses:  Acute cough     Rx / DC Orders   ED Discharge Orders          Ordered    benzonatate (TESSALON PERLES) 100 MG capsule  3 times daily PRN        10/01/22 1409    albuterol (VENTOLIN HFA) 108 (90 Base) MCG/ACT inhaler  Every 6 hours PRN        10/01/22 1409             Note:  This document was prepared using Dragon voice recognition software and may include unintentional dictation errors.   Teodoro Spray, [place] 10/01/22 1604    Naaman Plummer, MD 10/02/22 [id number]

## 2022-10-01 NOTE — Discharge Instructions (Addendum)
-  I suspect that you likely have a lower viral respiratory infection, causing some bronchitis.  This typically resolves on its own, though you may have residual cough the next few weeks.  You may take the benzonatate as needed for the cough.  You may additionally utilize the albuterol inhaler as needed for chest tightness and excessive coughing episodes.  -Follow-up with your primary care provider within the next week as needed.  -Return to the emergency department anytime if you begin to experience any new or worsening symptoms.

## 2022-10-01 NOTE — ED Triage Notes (Signed)
Pt comes with vomiting and cough since yesterday.

## 2023-07-11 ENCOUNTER — Emergency Department
Admission: EM | Admit: 2023-07-11 | Discharge: 2023-07-11 | Disposition: A | Discharge status: Home / Self Care | Payer: Self-pay | Attending: Emergency Medicine | Admitting: Emergency Medicine

## 2023-07-11 ENCOUNTER — Other Ambulatory Visit: Payer: Self-pay

## 2023-07-11 DIAGNOSIS — K5900 Constipation, unspecified: Secondary | ICD-10-CM | POA: Insufficient documentation

## 2023-07-11 DIAGNOSIS — R140 Abdominal distension (gaseous): Secondary | ICD-10-CM | POA: Insufficient documentation

## 2023-07-11 DIAGNOSIS — R112 Nausea with vomiting, unspecified: Secondary | ICD-10-CM | POA: Insufficient documentation

## 2023-07-11 LAB — URINALYSIS, ROUTINE W REFLEX MICROSCOPIC
Bilirubin Urine: NEGATIVE
Glucose, UA: NEGATIVE mg/dL
Hgb urine dipstick: NEGATIVE
Ketones, ur: NEGATIVE mg/dL
Leukocytes,Ua: NEGATIVE
Nitrite: NEGATIVE
Protein, ur: 100 mg/dL — AB
Specific Gravity, Urine: 1.029 (ref 1.005–1.030)
pH: 7 (ref 5.0–8.0)

## 2023-07-11 LAB — COMPREHENSIVE METABOLIC PANEL
ALT: 19 U/L (ref 0–44)
AST: 26 U/L (ref 15–41)
Albumin: 4.2 g/dL (ref 3.5–5.0)
Alkaline Phosphatase: 80 U/L (ref 38–126)
Anion gap: 11 (ref 5–15)
BUN: 19 mg/dL (ref 6–20)
CO2: 31 mmol/L (ref 22–32)
Calcium: 9.7 mg/dL (ref 8.9–10.3)
Chloride: 92 mmol/L — ABNORMAL LOW (ref 98–111)
Creatinine, Ser: 0.7 mg/dL (ref 0.44–1.00)
GFR, Estimated: >60 mL/min (ref >60)
Glucose, Bld: 127 mg/dL — ABNORMAL HIGH (ref 70–99)
Potassium: 3.3 mmol/L — ABNORMAL LOW (ref 3.5–5.1)
Sodium: 134 mmol/L — ABNORMAL LOW (ref 135–145)
Total Bilirubin: 0.9 mg/dL (ref 0.3–1.2)
Total Protein: 7.9 g/dL (ref 6.5–8.1)

## 2023-07-11 LAB — CBC
HCT: 46.8 % — ABNORMAL HIGH (ref 36.0–46.0)
Hemoglobin: 15 g/dL (ref 12.0–15.0)
MCH: 27.1 pg (ref 26.0–34.0)
MCHC: 32.1 g/dL (ref 30.0–36.0)
MCV: 84.6 fL (ref 80.0–100.0)
Platelets: 364 10*3/uL (ref 150–400)
RBC: 5.53 MIL/uL — ABNORMAL HIGH (ref 3.87–5.11)
RDW: 13.5 % (ref 11.5–15.5)
WBC: 13.8 10*3/uL — ABNORMAL HIGH (ref 4.0–10.5)
nRBC: 0 % (ref 0.0–0.2)

## 2023-07-11 LAB — LIPASE, BLOOD: Lipase: 31 U/L (ref 11–51)

## 2023-07-11 LAB — POC URINE PREG, ED: Preg Test, Ur: NEGATIVE

## 2023-07-11 MED ORDER — ONDANSETRON 4 MG PO TBDP: 4.0000 mg | ORAL_TABLET | Freq: Three times a day (TID) | ORAL | 0 refills | Status: DC | PRN

## 2023-07-11 MED ORDER — SODIUM CHLORIDE 0.9 % IV BOLUS
1000.0000 mL | Freq: Once | INTRAVENOUS | Status: AC
  Administered 2023-07-11: 1000 mL via INTRAVENOUS

## 2023-07-11 MED ORDER — ONDANSETRON HCL 4 MG/2ML IJ SOLN
4.0000 mg | Freq: Once | INTRAMUSCULAR | Status: AC
  Administered 2023-07-11: 4 mg via INTRAVENOUS
  Filled 2023-07-11: qty 2

## 2023-07-11 NOTE — ED Provider Notes (Signed)
[facility] Provider Note    Event Date/Time   First MD Initiated Contact with Patient 07/11/23 1423     (approximate)   History   Emesis and Nausea   HPI  KISHA KRISS is a 42 y.o. female with no active problems who presents with nausea and vomiting for last 3 days, persistent course, associated with constipation and some abdominal bloating but no abdominal pain.  She has no fever or chills.  She denies any diarrhea.  She states that she has had episodes like this previously that will last a few days.  She states she tried to use marijuana to help with the nausea, although is not a regular smoker.  She does not have any history of cyclical vomiting or hyperemesis.  I reviewed the past medical records.  The patient several prior ED visits over the last few years for various symptoms, once in 2023 with vomiting.  She was most recent admitted in 2018 for childbirth.   Physical Exam   Triage Vital Signs: ED Triage Vitals  Encounter Vitals Group     BP 07/11/23 1352 (!) 130/97     Systolic BP Percentile --      Diastolic BP Percentile --      Pulse Rate 07/11/23 1352 87     Resp 07/11/23 1352 16     Temp 07/11/23 1355 98.3 F (36.8 C)     Temp Source 07/11/23 1355 Oral     SpO2 07/11/23 1352 97 %     Weight 07/11/23 1353 90 lb (40.8 kg)     Height 07/11/23 1353 5' (1.524 m)     Head Circumference --      Peak Flow --      Pain Score 07/11/23 1350 0     Pain Loc --      Pain Education --      Exclude from Growth Chart --     Most recent vital signs: Vitals:   07/11/23 1355 07/11/23 1821  BP:  116/85  Pulse:  81  Resp:  16  Temp: 98.3 F (36.8 C)   SpO2:  98%     General: Awake, no distress.  CV:  Good peripheral perfusion.  Resp:  Normal effort.  Abd:  Soft and nontender.  No distention.  Other:  No jaundice or scleral icterus.  Slightly dry mucous membranes.   ED Results / Procedures / Treatments   Labs (all labs ordered are  listed, but only abnormal results are displayed) Labs Reviewed  COMPREHENSIVE METABOLIC PANEL - Abnormal; Notable for the following components:      Result Value   Sodium 134 (*)    Potassium 3.3 (*)    Chloride 92 (*)    Glucose, Bld 127 (*)    All other components within normal limits  CBC - Abnormal; Notable for the following components:   WBC 13.8 (*)    RBC 5.53 (*)    HCT 46.8 (*)    All other components within normal limits  URINALYSIS, ROUTINE W REFLEX MICROSCOPIC - Abnormal; Notable for the following components:   Color, Urine AMBER (*)    APPearance HAZY (*)    Protein, ur 100 (*)    Bacteria, UA RARE (*)    All other components within normal limits  LIPASE, BLOOD  POC URINE PREG, ED     EKG    RADIOLOGY    PROCEDURES:  Critical Care performed: No  Procedures   MEDICATIONS  ORDERED IN ED: Medications  sodium chloride 0.9 % bolus 1,000 mL (0 mLs Intravenous Stopped 07/11/23 1818)  ondansetron (ZOFRAN) injection 4 mg (4 mg Intravenous Given 07/11/23 1534)     IMPRESSION / MDM / ASSESSMENT AND PLAN / ED COURSE  I reviewed the triage vital signs and the nursing notes.  42 year old female with PMH as noted above presents with nausea and vomiting for the last several days associated with abdominal bloating and constipation but no other acute symptoms.  On exam the patient is overall relatively well-appearing.  Her vital signs are normal.  Abdomen soft and nontender.  Differential diagnosis includes, but is not limited to, gastroenteritis, gastritis, PUD, gastroparesis, cyclical vomiting.  We will obtain lab workup, give fluids and antiemetics and reassess.  Given the lack of any abdominal pain or tenderness there is no evidence of bowel obstruction or other acute intra-abdominal etiology that would require imaging.  Patient's presentation is most consistent with acute complicated illness / injury requiring diagnostic  workup.  ----------------------------------------- 6:22 PM on 07/11/2023 -----------------------------------------  The patient is feeling significantly better after Zofran and fluids.  Labs are unremarkable with normal electrolytes, LFTs, and lipase.  There is mild leukocytosis consistent with possible viral etiology.  At this time, the patient is tolerating p.o.  She feels well and would like to go home.  She is stable for discharge.  I counseled her on the results of the workup and plan of care.  I gave strict return precautions and she expressed understanding.   FINAL CLINICAL IMPRESSION(S) / ED DIAGNOSES   Final diagnoses:  Nausea and vomiting, unspecified vomiting type     Rx / DC Orders   ED Discharge Orders          Ordered    ondansetron (ZOFRAN-ODT) 4 MG disintegrating tablet  Every 8 hours PRN,   Status:  Discontinued        07/11/23 1822    ondansetron (ZOFRAN-ODT) 4 MG disintegrating tablet  Every 8 hours PRN        07/11/23 1828             Note:  This document was prepared using Dragon voice recognition software and may include unintentional dictation errors.    Dionne Bucy, MD 07/11/23 1921

## 2023-07-11 NOTE — ED Triage Notes (Signed)
Pt to ED for nausea, vomiting and bloating since Wed (3d) and no BM since Wed also. 3 episodes vomiting today. Denies pain.

## 2023-08-28 ENCOUNTER — Emergency Department
Admission: EM | Admit: 2023-08-28 | Discharge: 2023-08-28 | Disposition: A | Discharge status: Home / Self Care | Payer: Self-pay | Attending: Emergency Medicine | Admitting: Emergency Medicine

## 2023-08-28 ENCOUNTER — Other Ambulatory Visit: Payer: Self-pay

## 2023-08-28 DIAGNOSIS — J069 Acute upper respiratory infection, unspecified: Secondary | ICD-10-CM | POA: Insufficient documentation

## 2023-08-28 DIAGNOSIS — B349 Viral infection, unspecified: Secondary | ICD-10-CM | POA: Insufficient documentation

## 2023-08-28 DIAGNOSIS — Z20822 Contact with and (suspected) exposure to covid-19: Secondary | ICD-10-CM | POA: Insufficient documentation

## 2023-08-28 LAB — SARS CORONAVIRUS 2 BY RT PCR: SARS Coronavirus 2 by RT PCR: NEGATIVE

## 2023-08-28 MED ORDER — PSEUDOEPH-BROMPHEN-DM 30-2-10 MG/5ML PO SYRP: 5.0000 mL | ORAL_SOLUTION | Freq: Four times a day (QID) | ORAL | 0 refills | Status: AC | PRN

## 2023-08-28 MED ORDER — ONDANSETRON 4 MG PO TBDP
4.0000 mg | ORAL_TABLET | Freq: Once | ORAL | Status: AC
  Administered 2023-08-28: 4 mg via ORAL
  Filled 2023-08-28: qty 1

## 2023-08-28 MED ORDER — ONDANSETRON 4 MG PO TBDP: 4.0000 mg | ORAL_TABLET | Freq: Three times a day (TID) | ORAL | 0 refills | Status: AC | PRN

## 2023-08-28 NOTE — ED Provider Notes (Signed)
[facility] Provider Note    Event Date/Time   First MD Initiated Contact with Patient 08/28/23 1354     (approximate)   History   Cough   HPI  Destiny Ferguson is a 42 y.o. female with no significant past medical history presents emergency department with cough, nausea, sore throat and vomiting.  States had vomiting and diarrhea for 2 days.  Last episode was last night.  Was able to tolerate some liquids today.  No known fever.  States her kids had same symptoms.      Physical Exam   Triage Vital Signs: ED Triage Vitals [08/28/23 1326]  Encounter Vitals Group     BP 118/85     Systolic BP Percentile      Diastolic BP Percentile      Pulse Rate (!) 110     Resp 18     Temp 98.6 F (37 C)     Temp src      SpO2 95 %     Weight 100 lb (45.4 kg)     Height 5\' 1"  (1.549 m)     Head Circumference      Peak Flow      Pain Score 0     Pain Loc      Pain Education      Exclude from Growth Chart     Most recent vital signs: Vitals:   08/28/23 1326  BP: 118/85  Pulse: (!) 110  Resp: 18  Temp: 98.6 F (37 C)  SpO2: 95%     General: Awake, no distress.   CV:  Good peripheral perfusion. regular rate and  rhythm Resp:  Normal effort. Lungs cta Abd:  No distention.  Nontender, bowel sounds hyperactive all 4 quads Other:      ED Results / Procedures / Treatments   Labs (all labs ordered are listed, but only abnormal results are displayed) Labs Reviewed  SARS CORONAVIRUS 2 BY RT PCR     EKG     RADIOLOGY     PROCEDURES:   Procedures   MEDICATIONS ORDERED IN ED: Medications  ondansetron (ZOFRAN-ODT) disintegrating tablet 4 mg (4 mg Oral Given 08/28/23 1425)     IMPRESSION / MDM / ASSESSMENT AND PLAN / ED COURSE  I reviewed the triage vital signs and the nursing notes.                              Differential diagnosis includes, but is not limited to, COVID, influenza, gastroenteritis, viral  illness  Patient's presentation is most consistent with acute illness / injury with system symptoms.   Zofran ODT given to the patient. P.o. challenge  Awaiting COVID test.  Patient was able to tolerate fluids orally.  Heart rate has decreased to 99 when I rechecked it.  COVID test reassuring  Explain all the findings to the patient.  She is given a prescription for Zofran ODT and Bromfed cough syrup.  Follow-up with her regular doctor if not improving in 2 to 3 days.  Return if worsening.  She is in agreement treatment plan.  She is given discharge instructions with a work note.  Is in stable condition.      FINAL CLINICAL IMPRESSION(S) / ED DIAGNOSES   Final diagnoses:  Viral illness  Viral URI with cough     Rx / DC Orders   ED Discharge Orders  Ordered    ondansetron (ZOFRAN-ODT) 4 MG disintegrating tablet  Every 8 hours PRN        08/28/23 1504    brompheniramine-pseudoephedrine-DM 30-2-10 MG/5ML syrup  4 times daily PRN        08/28/23 1507             Note:  This document was prepared using Dragon voice recognition software and may include unintentional dictation errors.    Faythe Ghee, PA-C 08/28/23 1508    Corena Herter, MD 08/28/23 2028

## 2023-08-28 NOTE — ED Notes (Signed)
Discharge instructions reviewed with patient. Patient questions answered and opportunity for education reviewed. Patient voices understanding of discharge instructions with no further questions. Patient ambulatory with steady gait to lobby.  

## 2023-08-28 NOTE — ED Triage Notes (Signed)
Pt to ED for cough, nausea, sore throat. States her kids got her sick.

## 2024-11-11 ENCOUNTER — Emergency Department
Admission: EM | Admit: 2024-11-11 | Discharge: 2024-11-11 | Disposition: A | Discharge status: Home / Self Care | Payer: Self-pay | Attending: Emergency Medicine | Admitting: Emergency Medicine

## 2024-11-11 ENCOUNTER — Emergency Department: Payer: Self-pay

## 2024-11-11 ENCOUNTER — Other Ambulatory Visit: Payer: Self-pay

## 2024-11-11 DIAGNOSIS — J13 Pneumonia due to Streptococcus pneumoniae: Secondary | ICD-10-CM | POA: Insufficient documentation

## 2024-11-11 LAB — RESP PANEL BY RT-PCR (RSV, FLU A&B, COVID)  RVPGX2
Influenza A by PCR: NEGATIVE
Influenza B by PCR: NEGATIVE
Resp Syncytial Virus by PCR: NEGATIVE
SARS Coronavirus 2 by RT PCR: NEGATIVE

## 2024-11-11 MED ORDER — DOXYCYCLINE HYCLATE 100 MG PO TABS
100.0000 mg | ORAL_TABLET | Freq: Once | ORAL | Status: AC
  Administered 2024-11-11: 100 mg via ORAL
  Filled 2024-11-11: qty 1

## 2024-11-11 MED ORDER — ACETAMINOPHEN 500 MG PO TABS
500.0000 mg | ORAL_TABLET | Freq: Once | ORAL | Status: AC
  Administered 2024-11-11: 500 mg via ORAL
  Filled 2024-11-11: qty 1

## 2024-11-11 MED ORDER — DOXYCYCLINE HYCLATE 100 MG PO TABS: 100.0000 mg | ORAL_TABLET | Freq: Two times a day (BID) | ORAL | 0 refills | Status: AC

## 2024-11-11 NOTE — ED Triage Notes (Signed)
 Pt reports her symptoms started Sunday, vomiting, cough, congestion, sore throat, states that she had been around children who had also been sick

## 2024-11-11 NOTE — ED Provider Notes (Addendum)
 "  [facility] Provider Note    Event Date/Time   First MD Initiated Contact with Patient 11/11/24 1811     (approximate)   History   Cough, Fever, and Sore Throat    HPI  Destiny Ferguson is a 42 y.o. female    with a past medical history of nausea and vomiting, gastroenteritis, acute cough,  who presents to the ED complaining of vomiting, cough, congestion, sore throat. According to the patient, symptoms a week ago with productive cough, vomit, nasal congestion and sore throat.  Patient endorses having shortness of breath, she does not use inhaler.  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  Patient is a smoker.  Patient is here by herself.     Patient Active Problem List   Diagnosis Date Noted   Pregnancy 06/17/2017   Postpartum care following vaginal delivery 11/08/2015   Labor and delivery, indication for care 11/06/2015     Physical Exam   Triage Vital Signs: ED Triage Vitals  Encounter Vitals Group     BP 11/11/24 1635 (!) 122/90     Girls Systolic BP Percentile --      Girls Diastolic BP Percentile --      Boys Systolic BP Percentile --      Boys Diastolic BP Percentile --      Pulse Rate 11/11/24 1635 (!) 126     Resp 11/11/24 1635 20     Temp 11/11/24 1635 100 F (37.8 C)     Temp Source 11/11/24 1635 Oral     SpO2 11/11/24 1635 96 %     Weight 11/11/24 1636 98 lb (44.5 kg)     Height 11/11/24 1636 5' 1 (1.549 m)     Head Circumference --      Peak Flow --      Pain Score 11/11/24 1636 5     Pain Loc --      Pain Education --      Exclude from Growth Chart --     Most recent vital signs: Vitals:   11/11/24 1635 11/11/24 1935  BP: (!) 122/90 110/82  Pulse: (!) 126 (!) 115  Resp: 20 19  Temp: 100 F (37.8 C)   SpO2: 96% 94%     Physical Exam Vitals and nursing note reviewed.  During triage patient was hypertensive, tachycardic  General:          Awake, no distress.  No use of accessory muscles Ears: Bilateral otoscopy  within normal limits Throat: Peritonsillar erythema. CV:                  Good peripheral perfusion. Regular rate and rhythm. Resp:               Normal effort. no tachypnea.Equal breath sounds bilaterally.  Mobilization of secretions in right lobe Abd:                 No distention.  Soft nontender Other:               ED Results / Procedures / Treatments   Labs (all labs ordered are listed, but only abnormal results are displayed) Labs Reviewed  RESP PANEL BY RT-PCR (RSV, FLU A&B, COVID)  [id number]      RADIOLOGY I independently reviewed and interpreted imaging and agree with radiologists findings.      PROCEDURES:  Critical Care performed:   Procedures   MEDICATIONS ORDERED IN ED: Medications  doxycycline  (VIBRA -TABS)  tablet 100 mg (has no administration in time range)  acetaminophen  (TYLENOL ) tablet 500 mg (500 mg Oral Given 11/11/24 1900)   Clinical Course as of 11/11/24 2250  Fri Nov 11, 2024  1831 Resp panel by RT-PCR (RSV, Flu A&B, Covid) Anterior Nasal Swab Negative [AE]  2026 Patient continues with tachycardia.  Chest x-ray are pending.  I will discharge patient with antibiotics and follow-up with PCP.  Patient is agreeable with the plan. [AE]  2250 DG Chest 2 View  New patchy nodular airspace opacity in the right upper lobe. Findings may be infectious/inflammatory, but other etiologies are not excluded. Recommend short term follow up x ray or follow up CT for further evaluation .   [AE]    Clinical Course User Index [AE] Janit Kast, PA-C    IMPRESSION / MDM / ASSESSMENT AND PLAN / ED COURSE  I reviewed the triage vital signs and the nursing notes.  Differential diagnosis includes, but is not limited to, viral illness, influenza, COVID, RSV, pneumonia  Patient's presentation is most consistent with acute complicated illness / injury requiring diagnostic workup.   Destiny Ferguson is a 43 y.o., female presents today with history of 1 week of  cough, fever.  See HPI for further information.  On physical exam patient was tachycardic and hypertensive during triage.  No use of accessory muscles.  Throat mild peritonsillar erythema.  Cardiopulmonary there is presence of moderate mobilization of secretions in the right lung.  Rest of physical exam is normal. Plan Chest x-ray Acetaminophen  Respiratory panel came back negative for influenza, RSV, COVID Prednisone During admission patient continued tachycardic.  Chest x-ray report is pending.  Due to physical exam I will prescribe doxycycline  to treat pneumonia.  She will receive first doses of doxycycline  due at the pharmacy hours. Patient's diagnosis is consistent with right pneumonia. I independently reviewed and interpreted imaging and agree with radiologists findings. Labs are  reassuring ruling out influenza, COVID, RSV.. I did review the patient's allergies and medications.The patient is in stable and satisfactory condition for discharge home  Patient will be discharged home with prescriptions for doxycycline .  I did advise patient to drink plenty fluids, to take acetaminophen  every 6 hours for pain and fever.  Patient is to follow up with PCP as needed or otherwise directed. Patient is given ED precautions to return to the ED for any worsening or new symptoms.  Work note will be provided. Discussed plan of care with patient, answered all of patient's questions, patient agreeable to plan of care. Advised patient to take medications according to the instructions on the label. Discussed possible side effects of new medications. Patient verbalized understanding.   10:50 PM Patient was discharged 3 hours ago but x-ray just came  confirming pneumonia of right upper lobe.  I will call patient to update her FINAL CLINICAL IMPRESSION(S) / ED DIAGNOSES   Final diagnoses:  Pneumonia of right upper lobe due to Streptococcus pneumoniae     Rx / DC Orders   ED Discharge Orders          Ordered     doxycycline  (VIBRA -TABS) 100 MG tablet  2 times daily        11/11/24 2031             Note:  This document was prepared using Dragon voice recognition software and may include unintentional dictation errors.   Janit Kast, PA-C 11/11/24 2032    Janit Kast, PA-C 11/11/24 2252    Dorothyann Drivers,  MD 11/12/24 2241  "

## 2024-11-11 NOTE — Discharge Instructions (Addendum)
 You have been diagnosed with pneumonia.  Please take doxycycline  1 tablet by mouth every 12 hours for 10 days.  Please drink plenty of fluids.  You can take acetaminophen  every 6 hours as needed for pain or fever.  Please come back to ED for acute symptoms symptoms worsen.
